# Patient Record
Sex: Female | Born: 1955 | Race: White | Hispanic: No | Smoking: Former smoker
Health system: Southern US, Community
[De-identification: ages and names within clinical notes are randomized; demographics above are authoritative.]

## PROBLEM LIST (undated history)

## (undated) DIAGNOSIS — Z853 Personal history of malignant neoplasm of breast: Secondary | ICD-10-CM

## (undated) DIAGNOSIS — G43909 Migraine, unspecified, not intractable, without status migrainosus: Secondary | ICD-10-CM

## (undated) DIAGNOSIS — B009 Herpesviral infection, unspecified: Secondary | ICD-10-CM

## (undated) DIAGNOSIS — H04123 Dry eye syndrome of bilateral lacrimal glands: Secondary | ICD-10-CM

## (undated) DIAGNOSIS — E669 Obesity, unspecified: Secondary | ICD-10-CM

## (undated) DIAGNOSIS — L719 Rosacea, unspecified: Secondary | ICD-10-CM

## (undated) DIAGNOSIS — C801 Malignant (primary) neoplasm, unspecified: Secondary | ICD-10-CM

## (undated) DIAGNOSIS — M1990 Unspecified osteoarthritis, unspecified site: Secondary | ICD-10-CM

## (undated) DIAGNOSIS — G4730 Sleep apnea, unspecified: Secondary | ICD-10-CM

## (undated) HISTORY — PX: LASIK: SHX215

## (undated) HISTORY — DX: Obesity, unspecified: E66.9

## (undated) HISTORY — DX: Personal history of malignant neoplasm of breast: Z85.3

## (undated) HISTORY — DX: Unspecified osteoarthritis, unspecified site: M19.90

## (undated) HISTORY — PX: JOINT REPLACEMENT: SHX530

## (undated) HISTORY — PX: TOTAL HIP ARTHROPLASTY: SHX124

## (undated) HISTORY — DX: Malignant (primary) neoplasm, unspecified: C80.1

## (undated) HISTORY — DX: Dry eye syndrome of bilateral lacrimal glands: H04.123

## (undated) HISTORY — DX: Herpesviral infection, unspecified: B00.9

## (undated) HISTORY — DX: Sleep apnea, unspecified: G47.30

## (undated) HISTORY — DX: Migraine, unspecified, not intractable, without status migrainosus: G43.909

## (undated) HISTORY — DX: Rosacea, unspecified: L71.9

---

## 2007-09-07 HISTORY — PX: BREAST SURGERY: SHX581

## 2014-07-17 ENCOUNTER — Ambulatory Visit: Payer: Self-pay | Admitting: Family Medicine

## 2014-08-14 ENCOUNTER — Ambulatory Visit (INDEPENDENT_AMBULATORY_CARE_PROVIDER_SITE_OTHER): Payer: BC Managed Care – PPO | Admitting: Physician Assistant

## 2014-08-14 ENCOUNTER — Encounter: Payer: Self-pay | Admitting: Physician Assistant

## 2014-08-14 VITALS — BP 158/83 | HR 104 | Ht 62.5 in | Wt 235.0 lb

## 2014-08-14 DIAGNOSIS — N390 Urinary tract infection, site not specified: Secondary | ICD-10-CM | POA: Diagnosis not present

## 2014-08-14 DIAGNOSIS — R030 Elevated blood-pressure reading, without diagnosis of hypertension: Secondary | ICD-10-CM

## 2014-08-14 DIAGNOSIS — M1712 Unilateral primary osteoarthritis, left knee: Secondary | ICD-10-CM | POA: Diagnosis not present

## 2014-08-14 DIAGNOSIS — E785 Hyperlipidemia, unspecified: Secondary | ICD-10-CM

## 2014-08-14 DIAGNOSIS — H04123 Dry eye syndrome of bilateral lacrimal glands: Secondary | ICD-10-CM

## 2014-08-14 DIAGNOSIS — R319 Hematuria, unspecified: Secondary | ICD-10-CM | POA: Diagnosis not present

## 2014-08-14 DIAGNOSIS — Z131 Encounter for screening for diabetes mellitus: Secondary | ICD-10-CM

## 2014-08-14 DIAGNOSIS — G43001 Migraine without aura, not intractable, with status migrainosus: Secondary | ICD-10-CM

## 2014-08-14 DIAGNOSIS — G4730 Sleep apnea, unspecified: Secondary | ICD-10-CM

## 2014-08-14 DIAGNOSIS — R159 Full incontinence of feces: Secondary | ICD-10-CM

## 2014-08-14 DIAGNOSIS — Z0000 Encounter for general adult medical examination without abnormal findings: Secondary | ICD-10-CM

## 2014-08-14 DIAGNOSIS — IMO0001 Reserved for inherently not codable concepts without codable children: Secondary | ICD-10-CM

## 2014-08-14 LAB — POCT URINALYSIS DIPSTICK
BILIRUBIN UA: NEGATIVE
Glucose, UA: NEGATIVE
Ketones, UA: NEGATIVE
Nitrite, UA: POSITIVE
Protein, UA: 100
Spec Grav, UA: <=1.005
Urobilinogen, UA: 0.2
pH, UA: 6

## 2014-08-14 MED ORDER — CEFUROXIME AXETIL 500 MG PO TABS: 500.0000 mg | ORAL_TABLET | Freq: Two times a day (BID) | ORAL | Status: DC

## 2014-08-15 LAB — COMPLETE METABOLIC PANEL WITH GFR
ALT: 45 U/L — ABNORMAL HIGH (ref 0–35)
AST: 33 U/L (ref 0–37)
Albumin: 4.5 g/dL (ref 3.5–5.2)
Alkaline Phosphatase: 78 U/L (ref 39–117)
BILIRUBIN TOTAL: 0.7 mg/dL (ref 0.2–1.2)
BUN: 10 mg/dL (ref 6–23)
CO2: 23 meq/L (ref 19–32)
CREATININE: 0.56 mg/dL (ref 0.50–1.10)
Calcium: 9.9 mg/dL (ref 8.4–10.5)
Chloride: 105 mEq/L (ref 96–112)
GFR, Est Non African American: >89 mL/min
GLUCOSE: 125 mg/dL — AB (ref 70–99)
Potassium: 4.1 mEq/L (ref 3.5–5.3)
Sodium: 137 mEq/L (ref 135–145)
Total Protein: 7.5 g/dL (ref 6.0–8.3)

## 2014-08-15 LAB — LIPID PANEL
Cholesterol: 261 mg/dL — ABNORMAL HIGH (ref 0–200)
HDL: 53 mg/dL (ref >39)
LDL CALC: 170 mg/dL — AB (ref 0–99)
TRIGLYCERIDES: 191 mg/dL — AB (ref <150)
Total CHOL/HDL Ratio: 4.9 Ratio
VLDL: 38 mg/dL (ref 0–40)

## 2014-08-15 LAB — CBC WITH DIFFERENTIAL/PLATELET
BASOS ABS: 0 10*3/uL (ref 0.0–0.1)
Basophils Relative: 0 % (ref 0–1)
Eosinophils Absolute: 0.1 10*3/uL (ref 0.0–0.7)
Eosinophils Relative: 2 % (ref 0–5)
HCT: 44.4 % (ref 36.0–46.0)
Hemoglobin: 14.8 g/dL (ref 12.0–15.0)
Lymphocytes Relative: 21 % (ref 12–46)
Lymphs Abs: 1.4 10*3/uL (ref 0.7–4.0)
MCH: 29.1 pg (ref 26.0–34.0)
MCHC: 33.3 g/dL (ref 30.0–36.0)
MCV: 87.2 fL (ref 78.0–100.0)
MPV: 9.4 fL (ref 9.4–12.4)
Monocytes Absolute: 0.4 10*3/uL (ref 0.1–1.0)
Monocytes Relative: 6 % (ref 3–12)
NEUTROS ABS: 4.8 10*3/uL (ref 1.7–7.7)
NEUTROS PCT: 71 % (ref 43–77)
PLATELETS: 284 10*3/uL (ref 150–400)
RBC: 5.09 MIL/uL (ref 3.87–5.11)
RDW: 14.1 % (ref 11.5–15.5)
WBC: 6.7 10*3/uL (ref 4.0–10.5)

## 2014-08-15 LAB — VITAMIN B12: VITAMIN B 12: 1412 pg/mL — AB (ref 211–911)

## 2014-08-15 LAB — VITAMIN D 25 HYDROXY (VIT D DEFICIENCY, FRACTURES): Vit D, 25-Hydroxy: 40 ng/mL (ref 30–100)

## 2014-08-15 LAB — TSH: TSH: 3.683 u[IU]/mL (ref 0.350–4.500)

## 2014-08-15 LAB — T4, FREE: FREE T4: 1.02 ng/dL (ref 0.80–1.80)

## 2014-08-17 LAB — URINE CULTURE: Colony Count: >=100000

## 2014-08-19 ENCOUNTER — Encounter: Payer: Self-pay | Admitting: Physician Assistant

## 2014-08-19 DIAGNOSIS — R319 Hematuria, unspecified: Secondary | ICD-10-CM | POA: Insufficient documentation

## 2014-08-19 DIAGNOSIS — G43001 Migraine without aura, not intractable, with status migrainosus: Secondary | ICD-10-CM | POA: Insufficient documentation

## 2014-08-19 DIAGNOSIS — H04123 Dry eye syndrome of bilateral lacrimal glands: Secondary | ICD-10-CM | POA: Insufficient documentation

## 2014-08-19 DIAGNOSIS — R748 Abnormal levels of other serum enzymes: Secondary | ICD-10-CM | POA: Insufficient documentation

## 2014-08-19 DIAGNOSIS — R7301 Impaired fasting glucose: Secondary | ICD-10-CM | POA: Insufficient documentation

## 2014-08-19 DIAGNOSIS — G4730 Sleep apnea, unspecified: Secondary | ICD-10-CM | POA: Insufficient documentation

## 2014-08-19 DIAGNOSIS — R159 Full incontinence of feces: Secondary | ICD-10-CM | POA: Insufficient documentation

## 2014-08-19 DIAGNOSIS — E785 Hyperlipidemia, unspecified: Secondary | ICD-10-CM | POA: Insufficient documentation

## 2014-08-19 DIAGNOSIS — M1712 Unilateral primary osteoarthritis, left knee: Secondary | ICD-10-CM | POA: Insufficient documentation

## 2014-08-19 NOTE — Progress Notes (Signed)
Subjective:    Patient ID: Tina Mcgrath, female    DOB: 15-Dec-1955, 58 y.o.   MRN: [id number]  HPI Patient is a 58 year old female who presents to the clinic to establish care.  .. Active Ambulatory Problems    Diagnosis Date Noted  . Hyperlipidemia 08/19/2014  . Impaired fasting glucose 08/19/2014  . Elevated vitamin B12 level 08/19/2014  . Hematuria 08/19/2014  . Primary osteoarthritis of left knee 08/19/2014  . Dry eyes 08/19/2014  . Migraine without aura and with status migrainosus, not intractable 08/19/2014  . Mild sleep apnea 08/19/2014  . Fecal incontinence 08/19/2014   Resolved Ambulatory Problems    Diagnosis Date Noted  . No Resolved Ambulatory Problems   Past Medical History  Diagnosis Date  . Cancer   . Rosacea   . HSV-2 (herpes simplex virus 2) infection   . Osteoarthritis   . Sleep apnea   . Migraines    .[name] Family History  Problem Relation Age of Onset  . Hyperlipidemia Mother   . Hypertension Mother   . Cancer Father     prostate  . Heart attack Father   . Hypertension Father   . Hyperlipidemia Father   . Diabetes Father   . Alcohol abuse Brother   . Cancer Maternal Uncle     esophageal  . Cancer Paternal Aunt     breast  . Macular degeneration Maternal Grandmother   . Arthritis Maternal Grandmother   . Cancer Maternal Grandfather     multiple myeloma, bone marrow  . Stroke Paternal Grandmother   . Hypertension Paternal Grandmother   . Diabetes Paternal Grandmother   . Osteoporosis Paternal Grandmother   . Heart attack Paternal Grandmother   . Hypertension Paternal Grandfather    .[name] History   Social History  . Marital Status: Divorced    Spouse Name: N/A    Number of Children: N/A  . Years of Education: N/A   Occupational History  . Not on file.   Social History Main Topics  . Smoking status: Former Research scientist (life sciences)  . Smokeless tobacco: Not on file  . Alcohol Use: No  . Drug Use: No  . Sexual Activity: No   Other Topics Concern  .  Not on file   Social History Narrative  . No narrative on file   Patient would like her urine rechecked today. She was recently seen at a urgent care and treated for 7 days for UTI. She always needs 10 days treatment. She continues to have urine odor. She has no other urinary tract symptoms. She always has blood in her urine.  Patient would like to go ahead and get fasting labs she's not had labs done in a while.  Review of Systems  All other systems reviewed and are negative.      Objective:   Physical Exam  Constitutional: She is oriented to person, place, and time. She appears well-developed and well-nourished.  HENT:  Head: Normocephalic and atraumatic.  Cardiovascular: Normal rate, regular rhythm and normal heart sounds.   Pulmonary/Chest: Effort normal and breath sounds normal.  No CVA tendernes.   Abdominal: Soft. Bowel sounds are normal. She exhibits no distension and no mass. There is no tenderness. There is no rebound and no guarding.  Neurological: She is alert and oriented to person, place, and time.  Psychiatric: She has a normal mood and affect. Her behavior is normal.          Assessment & Plan:  Elevated blood pressure-patient brings  in documented wall of blood pressures ranging from the 110-120/75-85. She reports when she comes to the doctor sometimes they are more elevated. Discussed the patient to continue to keep monitoring as needed. No treatment given today.  UTI- nitrates, leukocytes, blood. Hx of hematuria. Patient was treated with ceftin at her request for 10 days. Follow up nurse visit to recheck urine.   Hyperlipidemia-we'll check fasting labs today since last labs were done in 2013.  Screening done for diabetes.

## 2014-09-03 ENCOUNTER — Encounter: Payer: Self-pay | Admitting: Physician Assistant

## 2014-09-03 ENCOUNTER — Ambulatory Visit (INDEPENDENT_AMBULATORY_CARE_PROVIDER_SITE_OTHER): Payer: BC Managed Care – PPO | Admitting: Physician Assistant

## 2014-09-03 VITALS — BP 153/78 | HR 103 | Ht 62.5 in | Wt 235.0 lb

## 2014-09-03 DIAGNOSIS — N390 Urinary tract infection, site not specified: Secondary | ICD-10-CM | POA: Diagnosis not present

## 2014-09-03 DIAGNOSIS — E119 Type 2 diabetes mellitus without complications: Secondary | ICD-10-CM | POA: Diagnosis not present

## 2014-09-03 DIAGNOSIS — R809 Proteinuria, unspecified: Secondary | ICD-10-CM

## 2014-09-03 DIAGNOSIS — R7301 Impaired fasting glucose: Secondary | ICD-10-CM

## 2014-09-03 DIAGNOSIS — R82998 Other abnormal findings in urine: Secondary | ICD-10-CM

## 2014-09-03 DIAGNOSIS — R319 Hematuria, unspecified: Secondary | ICD-10-CM

## 2014-09-03 DIAGNOSIS — E782 Mixed hyperlipidemia: Secondary | ICD-10-CM | POA: Insufficient documentation

## 2014-09-03 LAB — POCT URINALYSIS DIPSTICK
Bilirubin, UA: NEGATIVE
GLUCOSE UA: NEGATIVE
Ketones, UA: NEGATIVE
Nitrite, UA: NEGATIVE
PROTEIN UA: 100
SPEC GRAV UA: 1.01
Urobilinogen, UA: 0.2
pH, UA: 5.5

## 2014-09-03 LAB — POCT GLYCOSYLATED HEMOGLOBIN (HGB A1C): Hemoglobin A1C: 6.6

## 2014-09-03 MED ORDER — VITAMIN D (ERGOCALCIFEROL) 1.25 MG (50000 UNIT) PO CAPS: 50000.0000 [IU] | ORAL_CAPSULE | ORAL | Status: DC

## 2014-09-03 MED ORDER — METFORMIN HCL 500 MG PO TABS: 500.0000 mg | ORAL_TABLET | Freq: Two times a day (BID) | ORAL | Status: DC

## 2014-09-03 MED ORDER — NITROFURANTOIN MONOHYD MACRO 100 MG PO CAPS: 100.0000 mg | ORAL_CAPSULE | Freq: Two times a day (BID) | ORAL | Status: DC

## 2014-09-03 NOTE — Progress Notes (Signed)
   Subjective:    Patient ID: Tina Mcgrath, female    DOB: 1955-09-14, 58 y.o.   MRN: [id number]  HPI  Pt comes in to go over lab results.   She does have hx of recurrent UTI's. She was treated at last visit. Never came back to check for resolution. She now has urinary symptoms again. She has some discomfort and hesistancy when she urinates. She has had some lower back pain. No fever, chills, n/v/d.   Review of Systems  All other systems reviewed and are negative.      Objective:   Physical Exam  Constitutional: She is oriented to person, place, and time. She appears well-developed and well-nourished.  HENT:  Head: Normocephalic and atraumatic.  Cardiovascular: Normal rate, regular rhythm and normal heart sounds.   Pulmonary/Chest: Effort normal and breath sounds normal.  No CVA tenderness.   Abdominal: Soft. Bowel sounds are normal. She exhibits no distension and no mass. There is no tenderness. There is no rebound and no guarding.  Neurological: She is alert and oriented to person, place, and time.  Skin: Skin is dry.  Psychiatric: She has a normal mood and affect. Her behavior is normal.          Assessment & Plan:  DM, controlled type II- A1C is 6.6. Metformin started today. 500mg  bid. Discussed side effects. Follow up as needed or in 3 months for a1c check. Discuss referral to nutritionist. Pt declined at this time.   Hyperlipidemia- since dx of DM new goal for LDL. Pt not toleratant of many statins. She would like to try livalo. No samples today. We are calling drug rep to get samples then pt will do a one month trial. disucssed diet and exercise changes today.   UTI- trace leuks and protein in urine. Will culture and since symptomatic treat at this time. macrobid given for 7 days. May need to consider referral to urology. Need to recheck and make sure protein clears as well.

## 2014-09-05 ENCOUNTER — Other Ambulatory Visit: Payer: Self-pay | Admitting: *Deleted

## 2014-09-05 LAB — URINE CULTURE: Colony Count: 5000

## 2014-09-05 MED ORDER — FLUCONAZOLE 150 MG PO TABS: 150.0000 mg | ORAL_TABLET | Freq: Once | ORAL | Status: DC

## 2014-09-10 ENCOUNTER — Encounter: Payer: Self-pay | Admitting: Physician Assistant

## 2014-09-10 ENCOUNTER — Other Ambulatory Visit: Payer: Self-pay | Admitting: *Deleted

## 2014-09-10 ENCOUNTER — Ambulatory Visit (INDEPENDENT_AMBULATORY_CARE_PROVIDER_SITE_OTHER): Payer: 59 | Admitting: Physician Assistant

## 2014-09-10 VITALS — BP 152/82 | HR 96 | Temp 97.5°F | Wt 235.0 lb

## 2014-09-10 DIAGNOSIS — R809 Proteinuria, unspecified: Secondary | ICD-10-CM | POA: Insufficient documentation

## 2014-09-10 DIAGNOSIS — N390 Urinary tract infection, site not specified: Secondary | ICD-10-CM

## 2014-09-10 DIAGNOSIS — R319 Hematuria, unspecified: Secondary | ICD-10-CM

## 2014-09-10 LAB — POCT URINALYSIS DIPSTICK
BILIRUBIN UA: NEGATIVE
Glucose, UA: NEGATIVE
Ketones, UA: NEGATIVE
LEUKOCYTES UA: NEGATIVE
Nitrite, UA: NEGATIVE
PH UA: 6
Protein, UA: 100
Spec Grav, UA: 1.01
Urobilinogen, UA: 0.2

## 2014-09-10 NOTE — Progress Notes (Signed)
   Subjective:    Patient ID: Tina Mcgrath, female    DOB: 1956-08-03, 59 y.o.   MRN: [id number]  HPI U Tina Mcgrath comes to office today for urine follow up. She said that she had a recent infection and does have a history of following with a urologist in the past. She denies burning or dysuria at this time. No other concerns. She said that she finished macrobid yesterday and started the diflucan today.    Review of Systems     Objective:   Physical Exam        Assessment & Plan:  UTI- seemed to be cleared. Trace blood and protien left. Hx of hematuria. Will get mircoscopic to evaluate for protein.

## 2014-09-12 ENCOUNTER — Other Ambulatory Visit: Payer: Self-pay | Admitting: *Deleted

## 2014-09-12 DIAGNOSIS — R809 Proteinuria, unspecified: Secondary | ICD-10-CM

## 2014-09-13 LAB — URINALYSIS, MICROSCOPIC ONLY
Bacteria, UA: NONE SEEN
CASTS: NONE SEEN
Crystals: NONE SEEN
Squamous Epithelial / LPF: NONE SEEN

## 2014-11-12 ENCOUNTER — Ambulatory Visit (INDEPENDENT_AMBULATORY_CARE_PROVIDER_SITE_OTHER): Payer: 59 | Admitting: Physician Assistant

## 2014-11-12 ENCOUNTER — Encounter: Payer: Self-pay | Admitting: Physician Assistant

## 2014-11-12 VITALS — BP 147/84 | HR 102 | Ht 62.5 in | Wt 230.0 lb

## 2014-11-12 DIAGNOSIS — C50911 Malignant neoplasm of unspecified site of right female breast: Secondary | ICD-10-CM | POA: Diagnosis not present

## 2014-11-12 DIAGNOSIS — IMO0001 Reserved for inherently not codable concepts without codable children: Secondary | ICD-10-CM

## 2014-11-12 DIAGNOSIS — R319 Hematuria, unspecified: Secondary | ICD-10-CM

## 2014-11-12 DIAGNOSIS — R030 Elevated blood-pressure reading, without diagnosis of hypertension: Secondary | ICD-10-CM

## 2014-11-12 DIAGNOSIS — N390 Urinary tract infection, site not specified: Secondary | ICD-10-CM

## 2014-11-12 DIAGNOSIS — Z1239 Encounter for other screening for malignant neoplasm of breast: Secondary | ICD-10-CM

## 2014-11-12 DIAGNOSIS — E785 Hyperlipidemia, unspecified: Secondary | ICD-10-CM

## 2014-11-12 DIAGNOSIS — R82998 Other abnormal findings in urine: Secondary | ICD-10-CM

## 2014-11-12 DIAGNOSIS — E118 Type 2 diabetes mellitus with unspecified complications: Secondary | ICD-10-CM

## 2014-11-12 DIAGNOSIS — R809 Proteinuria, unspecified: Secondary | ICD-10-CM

## 2014-11-12 LAB — POCT URINALYSIS DIPSTICK
Bilirubin, UA: NEGATIVE
Glucose, UA: NEGATIVE
KETONES UA: NEGATIVE
NITRITE UA: POSITIVE
PROTEIN UA: 30
Spec Grav, UA: 1.015
Urobilinogen, UA: 0.2
pH, UA: 5.5

## 2014-11-12 LAB — POCT UA - MICROALBUMIN
Creatinine, POC: 200 mg/dL
Microalbumin Ur, POC: 80 mg/L

## 2014-11-12 LAB — POCT GLYCOSYLATED HEMOGLOBIN (HGB A1C): HEMOGLOBIN A1C: 6.1

## 2014-11-12 MED ORDER — VALACYCLOVIR HCL 500 MG PO TABS: 500.0000 mg | ORAL_TABLET | ORAL | Status: DC | PRN

## 2014-11-12 MED ORDER — SUMATRIPTAN SUCCINATE 100 MG PO TABS: 100.0000 mg | ORAL_TABLET | ORAL | Status: DC | PRN

## 2014-11-12 MED ORDER — PITAVASTATIN CALCIUM 2 MG PO TABS
ORAL_TABLET | ORAL | Status: DC
Start: 2014-11-12 — End: 2014-12-10

## 2014-11-12 MED ORDER — METFORMIN HCL 500 MG PO TABS: 500.0000 mg | ORAL_TABLET | Freq: Two times a day (BID) | ORAL | Status: DC

## 2014-11-12 NOTE — Progress Notes (Signed)
   Subjective:    Patient ID: Tina Mcgrath, female    DOB: 1955/12/21, 59 y.o.   MRN: [id number]  HPI  Pt is a 59 yo female who presents to the clinic for 3 month follow up on diabetes. Doing well. Started metformin. When checking sugars running 75-115 fasting. No hypoglycemic events. No problems or concerned. Lost 5lbs. Trying to walk more and decrease carbs/sugars in diet.   BC, right- remission- needs 3D mammogram ordered.   Hx of UTI's and hematuria- no symptoms today. Will recheck urine.    Review of Systems  All other systems reviewed and are negative.      Objective:   Physical Exam  Constitutional: She is oriented to person, place, and time. She appears well-developed and well-nourished.  Obese.   HENT:  Head: Normocephalic and atraumatic.  Cardiovascular: Normal rate, regular rhythm and normal heart sounds.   Pulmonary/Chest: Effort normal and breath sounds normal.  No CVA tenderness.   Abdominal: Soft. Bowel sounds are normal. She exhibits no distension and no mass. There is no tenderness. There is no rebound and no guarding.  Neurological: She is alert and oriented to person, place, and time.  Psychiatric: She has a normal mood and affect. Her behavior is normal.          Assessment & Plan:  DM, type II- .[name] Lab Results  Component Value Date   HGBA1C 6.1 11/12/2014  looks great.  Continue metformin 500mg  bid.  Continue diet and exercise.  Encouraged eye exam.  mircoalbumin abnormal today. Will wait to send to nephrology until hematuria evaluated. microscopic negative.    Recurrent UTI/proteinuria/hematuria- . Results for orders placed or performed in visit on 11/12/14  Urinalysis, microscopic only  Result Value Ref Range   Squamous Epithelial / LPF NONE SEEN RARE   Crystals NONE SEEN NONE SEEN   Casts NONE SEEN NONE SEEN   WBC, UA 7-10 (A) <3 WBC/hpf   RBC / HPF 0-2 <3 RBC/hpf   Bacteria, UA MANY (A) RARE  POCT urinalysis dipstick  Result Value Ref  Range   Color, UA yellow    Clarity, UA cloudy    Glucose, UA neg    Bilirubin, UA neg    Ketones, UA neg    Spec Grav, UA 1.015    Blood, UA small    pH, UA 5.5    Protein, UA 30    Urobilinogen, UA 0.2    Nitrite, UA pos    Leukocytes, UA Trace   POCT HgB A1C  Result Value Ref Range   Hemoglobin A1C 6.1   POCT UA - Microalbumin  Result Value Ref Range   Microalbumin Ur, POC 80 mg/L   Creatinine, POC 200 mg/dL   Albumin/Creatinine Ratio, Urine, POC 30-300    Will culture. Last UTI had same dipstick results but insignifcant growth on culture. Since pt is asymptomatic will wait to treat until culture comes back. mircoalbumin abnormal and DM controlled. Will wait to send to nephrology since hematuria could be effecting protein and mircoalbumin. Will continue to monitor.    Hx of right breast cancer- needs 3D imaging ordered/diagnostic for follow up.   Elevated blood pressure- brings in monitor and shows me BP around low 120's to 75-85 on regular basis.   Hyperlipidemia- waited to start since starting metformin. Cannot tolerate statins. Given one month of livalo samples to try. Start with 3 times a week and gradually increase.

## 2014-11-12 NOTE — Patient Instructions (Signed)
Will get urology referral.

## 2014-11-13 DIAGNOSIS — C50919 Malignant neoplasm of unspecified site of unspecified female breast: Secondary | ICD-10-CM | POA: Insufficient documentation

## 2014-11-13 DIAGNOSIS — R030 Elevated blood-pressure reading, without diagnosis of hypertension: Secondary | ICD-10-CM

## 2014-11-13 DIAGNOSIS — IMO0001 Reserved for inherently not codable concepts without codable children: Secondary | ICD-10-CM | POA: Insufficient documentation

## 2014-11-13 LAB — URINALYSIS, MICROSCOPIC ONLY
CASTS: NONE SEEN
Crystals: NONE SEEN
Squamous Epithelial / LPF: NONE SEEN

## 2014-11-15 ENCOUNTER — Other Ambulatory Visit: Payer: Self-pay | Admitting: Physician Assistant

## 2014-11-15 LAB — URINE CULTURE: Colony Count: >=100000

## 2014-11-15 MED ORDER — NITROFURANTOIN MONOHYD MACRO 100 MG PO CAPS: 100.0000 mg | ORAL_CAPSULE | Freq: Two times a day (BID) | ORAL | Status: DC

## 2014-11-28 ENCOUNTER — Telehealth: Payer: Self-pay | Admitting: *Deleted

## 2014-11-28 DIAGNOSIS — E118 Type 2 diabetes mellitus with unspecified complications: Secondary | ICD-10-CM

## 2014-11-28 NOTE — Telephone Encounter (Signed)
Referral placed.

## 2014-12-09 ENCOUNTER — Telehealth: Payer: Self-pay | Admitting: *Deleted

## 2014-12-09 NOTE — Telephone Encounter (Signed)
Patient called in concerned she hasn't heard from referrals made to the opthalmology  & [place] (3-D mammogram).  Also states her insurance only pays for 90 day supply so she needs  Levlo 10 mg changed to a 90 day

## 2014-12-10 ENCOUNTER — Telehealth: Payer: Self-pay | Admitting: Physician Assistant

## 2014-12-10 ENCOUNTER — Other Ambulatory Visit: Payer: Self-pay | Admitting: Physician Assistant

## 2014-12-10 DIAGNOSIS — C50911 Malignant neoplasm of unspecified site of right female breast: Secondary | ICD-10-CM

## 2014-12-10 MED ORDER — PITAVASTATIN CALCIUM 2 MG PO TABS: ORAL_TABLET | ORAL | Status: DC

## 2014-12-10 NOTE — Telephone Encounter (Signed)
Both were ordered at last visit. Ortho referral as below.   Note    Sent referral with ov notes and insurance to [facility] at [phone] and [phone] they will call and schedule with patient for good appt time. Also sent referral through [facility] and received Auth #[id number] for 6 Visits good from 11/28/2014 - 05/31/2015. - CF     Can we call breast clinic and see what hold up is on 3D mammogram.   Please inform patient.

## 2014-12-10 NOTE — Telephone Encounter (Signed)
Received fax from [facility] and Livalo 2 mg is approved through 12/10/19 or until medication is no longer available on plan. auth # [id number] -Cf

## 2014-12-10 NOTE — Telephone Encounter (Signed)
Pt states she is taking mammogram order to [place] for completion.

## 2014-12-10 NOTE — Telephone Encounter (Signed)
Received fax for pa on Livalo 2mg  sent through cover my meds waiting on auth. - CF

## 2014-12-10 NOTE — Telephone Encounter (Signed)
I sent 90 supply to [facility] is that the right pharmacy?

## 2014-12-10 NOTE — Telephone Encounter (Signed)
Will route to [facility] for mammogram status - Cf

## 2014-12-10 NOTE — Telephone Encounter (Signed)
Patient called advised that she just needs a 3D Mammogram order today to take with her to [place] tomorrow. Patient states she knows her hmo is not going to pay for it so she is paying self pay and that office still needs an order for their records. They are not going to pay because it is [place] of state and they will send [name] a report. And patient stated that the sample script livalo worked and she would like a 9o day supply per Direese note. Thanks

## 2015-01-06 ENCOUNTER — Encounter: Payer: Self-pay | Admitting: Physician Assistant

## 2015-01-06 DIAGNOSIS — N3020 Other chronic cystitis without hematuria: Secondary | ICD-10-CM | POA: Insufficient documentation

## 2015-01-14 LAB — HM MAMMOGRAPHY: HM MAMMO: NORMAL

## 2015-02-19 ENCOUNTER — Encounter: Payer: Self-pay | Admitting: Physician Assistant

## 2015-03-01 ENCOUNTER — Other Ambulatory Visit: Payer: Self-pay | Admitting: Physician Assistant

## 2015-03-20 LAB — HM DIABETES EYE EXAM

## 2015-05-20 ENCOUNTER — Ambulatory Visit (INDEPENDENT_AMBULATORY_CARE_PROVIDER_SITE_OTHER): Payer: 59 | Admitting: Physician Assistant

## 2015-05-20 ENCOUNTER — Encounter: Payer: Self-pay | Admitting: Physician Assistant

## 2015-05-20 ENCOUNTER — Ambulatory Visit (INDEPENDENT_AMBULATORY_CARE_PROVIDER_SITE_OTHER): Payer: 59

## 2015-05-20 ENCOUNTER — Telehealth: Payer: Self-pay | Admitting: Physician Assistant

## 2015-05-20 VITALS — BP 143/56 | HR 101 | Ht 62.5 in | Wt 223.0 lb

## 2015-05-20 DIAGNOSIS — M25531 Pain in right wrist: Secondary | ICD-10-CM | POA: Diagnosis not present

## 2015-05-20 DIAGNOSIS — R252 Cramp and spasm: Secondary | ICD-10-CM | POA: Diagnosis not present

## 2015-05-20 DIAGNOSIS — Z1159 Encounter for screening for other viral diseases: Secondary | ICD-10-CM

## 2015-05-20 DIAGNOSIS — S93401A Sprain of unspecified ligament of right ankle, initial encounter: Secondary | ICD-10-CM | POA: Diagnosis not present

## 2015-05-20 DIAGNOSIS — E119 Type 2 diabetes mellitus without complications: Secondary | ICD-10-CM | POA: Diagnosis not present

## 2015-05-20 DIAGNOSIS — E785 Hyperlipidemia, unspecified: Secondary | ICD-10-CM

## 2015-05-20 DIAGNOSIS — N3020 Other chronic cystitis without hematuria: Secondary | ICD-10-CM

## 2015-05-20 DIAGNOSIS — E559 Vitamin D deficiency, unspecified: Secondary | ICD-10-CM

## 2015-05-20 DIAGNOSIS — Z114 Encounter for screening for human immunodeficiency virus [HIV]: Secondary | ICD-10-CM

## 2015-05-20 LAB — CBC WITH DIFFERENTIAL/PLATELET
BASOS ABS: 0 10*3/uL (ref 0.0–0.1)
Basophils Relative: 0 % (ref 0–1)
EOS ABS: 0.2 10*3/uL (ref 0.0–0.7)
Eosinophils Relative: 3 % (ref 0–5)
HEMATOCRIT: 42.1 % (ref 36.0–46.0)
Hemoglobin: 14.1 g/dL (ref 12.0–15.0)
LYMPHS ABS: 1.6 10*3/uL (ref 0.7–4.0)
LYMPHS PCT: 31 % (ref 12–46)
MCH: 29.2 pg (ref 26.0–34.0)
MCHC: 33.5 g/dL (ref 30.0–36.0)
MCV: 87.2 fL (ref 78.0–100.0)
MPV: 9.1 fL (ref 8.6–12.4)
Monocytes Absolute: 0.5 10*3/uL (ref 0.1–1.0)
Monocytes Relative: 9 % (ref 3–12)
NEUTROS PCT: 57 % (ref 43–77)
Neutro Abs: 2.9 10*3/uL (ref 1.7–7.7)
PLATELETS: 276 10*3/uL (ref 150–400)
RBC: 4.83 MIL/uL (ref 3.87–5.11)
RDW: 14.3 % (ref 11.5–15.5)
WBC: 5 10*3/uL (ref 4.0–10.5)

## 2015-05-20 LAB — VITAMIN B12: Vitamin B-12: 457 pg/mL (ref 211–911)

## 2015-05-20 LAB — POCT GLYCOSYLATED HEMOGLOBIN (HGB A1C): Hemoglobin A1C: 6.1

## 2015-05-20 LAB — FERRITIN: Ferritin: 93 ng/mL (ref 10–291)

## 2015-05-20 MED ORDER — METFORMIN HCL 500 MG PO TABS: 500.0000 mg | ORAL_TABLET | Freq: Two times a day (BID) | ORAL | Status: DC

## 2015-05-20 MED ORDER — LISINOPRIL 2.5 MG PO TABS: 2.5000 mg | ORAL_TABLET | Freq: Every day | ORAL | Status: DC

## 2015-05-20 NOTE — Telephone Encounter (Signed)
Pt is ok with starting a very low dose ACE.

## 2015-05-20 NOTE — Telephone Encounter (Signed)
Call patient-I just realized after reviewing chart that patient is not on a Ace inhibitor. Is there any reason why she is not on this. If not I would like to start a low-dose ace today. This will help to protect her kidneys especially with her positive microalbumin.

## 2015-05-20 NOTE — Patient Instructions (Addendum)
Will send [facility] new referral for next year.  Will get records from [facility] for eye exam.   Acute Ankle Sprain with Phase II Rehab An acute ankle sprain is a partial or complete tear in one or more of the ligaments of the ankle due to traumatic injury. The severity of the injury depends on both the number of ligaments sprained and the grade of sprain. There are 3 grades of sprains.  A grade 1 sprain is a mild sprain. There is a slight pull without obvious tearing. There is no loss of strength, and the muscle and ligament are the correct length.  A grade 2 sprain is a moderate sprain. There is tearing of fibers within the substance of the ligament where it connects two bones or two cartilages. The length of the ligament is increased, and there is usually decreased strength.  A grade 3 sprain is a complete rupture of the ligament and is uncommon. In addition to the grade of sprain, there are 3 types of ankle sprains.  Lateral ankle sprains. This is a sprain of one or more of the 3 ligaments on the outer side (lateral) of the ankle. These are the most common sprains. Medial ankle sprains. There is one large triangular ligament on the inner side (medial) of the ankle that is susceptible to injury. Medial ankle sprains are less common. Syndesmosis, "high ankle," sprains. The syndesmosis is the ligament that connects the two bones of the lower leg. Syndesmosis sprains usually only occur with very severe ankle sprains. SYMPTOMS  Pain, tenderness, and swelling in the ankle, starting at the side of injury that may progress to the whole ankle and foot with time.  "Pop" or tearing sensation at the time of injury.  Bruising that may spread to the heel.  Impaired ability to walk soon after injury. CAUSES   Acute ankle sprains are caused by trauma placed on the ankle that temporarily forces or pries the anklebone (talus) out of its normal socket.  Stretching or tearing of the  ligaments that normally hold the joint in place (usually due to a twisting injury). RISK INCREASES WITH:  Previous ankle sprain.  Sports in which the foot may land awkwardly (basketball, volleyball, soccer) or walking or running on uneven or rough surfaces.  Shoes with inadequate support to prevent sideways motion when stress occurs.  Poor strength and flexibility.  Poor balance skills.  Contact sports. PREVENTION  Warm up and stretch properly before activity.  Maintain physical fitness:  Ankle and leg flexibility, muscle strength, and endurance.  Cardiovascular fitness.  Balance training activities.  Use proper technique and have a coach correct improper technique.  Taping, protective strapping, bracing, or high-top tennis shoes may help prevent injury. Initially, tape is best. However, it loses most of its support function within 10 to 15 minutes.  Wear proper fitted protective shoes. Combining high-top shoes with taping or bracing is more effective than using either alone.  Provide the ankle with support during sports and practice activities for 12 months following injury. PROGNOSIS   If treated properly, ankle sprains can be expected to recover completely. However, the length of recovery depends on the degree of injury.  A grade 1 sprain usually heals enough in 5 to 7 days to allow modified activity and requires an average of 6 weeks to heal completely.  A grade 2 sprain requires 6 to 10 weeks to heal completely.  A grade 3 sprain requires 12 to 16 weeks to heal.  A syndesmosis sprain often takes more than 3 months to heal. RELATED COMPLICATIONS   Frequent recurrence of symptoms may result in a chronic problem. Appropriately addressing the problem the first time decreases the frequency of recurrence and optimizes healing time. Severity of initial sprain does not predict the likelihood of later instability.  Injury to other structures (bone, cartilage, or  tendon).  Chronically unstable or arthritic ankle joint are possible with repeated sprains. TREATMENT Treatment initially involves the use of ice, medicine, and compression bandages to help reduce pain and inflammation. Ankle sprains are usually immobilized in a walking cast or boot to allow for healing. Crutches may be recommended to reduce pressure on the injury. After immobilization, strengthening and stretching exercises may be necessary to regain strength and a full range of motion. Surgery is rarely needed to treat ankle sprains. MEDICATION   Nonsteroidal anti-inflammatory medicines, such as aspirin and ibuprofen (do not take for the first 3 days after injury or within 7 days before surgery), or other minor pain relievers, such as acetaminophen, are often recommended. Take these as directed by your caregiver. Contact your caregiver immediately if any bleeding, stomach upset, or signs of an allergic reaction occur from these medicines.  Ointments applied to the skin may be helpful.  Pain relievers may be prescribed as necessary by your caregiver. Do not take prescription pain medicine for longer than 4 to 7 days. Use only as directed and only as much as you need. HEAT AND COLD  Cold treatment (icing) is used to relieve pain and reduce inflammation for acute and chronic cases. Cold should be applied for 10 to 15 minutes every 2 to 3 hours for inflammation and pain and immediately after any activity that aggravates your symptoms. Use ice packs or an ice massage.  Heat treatment may be used before performing stretching and strengthening activities prescribed by your caregiver. Use a heat pack or a warm soak. SEEK IMMEDIATE MEDICAL CARE IF:   Pain, swelling, or bruising worsens despite treatment.  You experience pain, numbness, discoloration, or coldness in the foot or toes.  New, unexplained symptoms develop. (Drugs used in treatment may produce side effects.) EXERCISES  PHASE II  EXERCISES RANGE OF MOTION (ROM) AND STRETCHING EXERCISES - Ankle Sprain, Acute-Phase II, Weeks 3 to 4 After your physician, physical therapist, or athletic trainer feels your knee has made progress significant enough to begin more advanced exercises, he or she may recommend completing some of the following exercises. Although each person heals at different rates, most people will be ready for these exercises between 3 and 4 weeks after their injury. Do not begin these exercises until you have your caregiver's permission. He or she may also advise you to continue with the exercises which you completed in Phase I of your rehabilitation. While completing these exercises, remember:   Restoring tissue flexibility helps normal motion to return to the joints. This allows healthier, less painful movement and activity.  An effective stretch should be held for at least 30 seconds.  A stretch should never be painful. You should only feel a gentle lengthening or release in the stretched tissue. RANGE OF MOTION - Ankle Plantar Flexion   Sit with your right / left leg crossed over your opposite knee.  Use your opposite hand to pull the top of your foot and toes toward you.  You should feel a gentle stretch on the top of your foot/ankle. Hold this position for __________. Repeat __________ times. Complete __________ times per day.  RANGE OF MOTION - Ankle Eversion  Sit with your right / left ankle crossed over your opposite knee.  Grip your foot with your opposite hand, placing your thumb on the top of your foot and your fingers across the bottom of your foot.  Gently push your foot downward with a slight rotation so your littlest toes rise slightly  You should feel a gentle stretch on the inside of your ankle. Hold the stretch for __________ seconds. Repeat __________ times. Complete this exercise __________ times per day.  RANGE OF MOTION - Ankle Inversion  Sit with your right / left ankle crossed  over your opposite knee.  Grip your foot with your opposite hand, placing your thumb on the bottom of your foot and your fingers across the top of your foot.  Gently pull your foot so the smallest toe comes toward you and your thumb pushes the inside of the ball of your foot away from you.  You should feel a gentle stretch on the outside of your ankle. Hold the stretch for __________ seconds. Repeat __________ times. Complete this exercise __________ times per day.  STRETCH - Gastrocsoleus  Sit with your right / left leg extended. Holding onto both ends of a belt or towel, loop it around the ball of your foot.  Keeping your right / left ankle and foot relaxed and your knee straight, pull your foot and ankle toward you using the belt/towel.  You should feel a gentle stretch behind your calf or knee. Hold this position for __________ seconds. Repeat __________ times. Complete this stretch __________ times per day.  RANGE OF MOTION - Ankle Dorsiflexion, Active Assisted  Remove shoes and sit on a chair that is preferably not on a carpeted surface.  Place right / left foot under knee. Extend your opposite leg for support.  Keeping your heel down, slide your right / left foot back toward the chair until you feel a stretch at your ankle or calf. If you do not feel a stretch, slide your bottom forward to the edge of the chair while still keeping your heel down.  Hold this stretch for __________ seconds. Repeat __________ times. Complete this stretch __________ times per day.  STRETCH - Gastroc, Standing   Place hands on wall.  Extend right / left leg and place a folded washcloth under the arch of your foot for support. Keep the front knee somewhat bent.  Slightly point your toes inward on your back foot.  Keeping your right / left heel on the floor and your knee straight, shift your weight toward the wall, not allowing your back to arch.  You should feel a gentle stretch in the calf. Hold  this position for __________ seconds. Repeat __________ times. Complete this stretch __________ times per day. STRETCH - Soleus, Standing  Place hands on wall.  Extend right / left leg and place a folded washcloth under the arch of your foot for support. Keep the front knee somewhat bent.  Slightly point your toes inward on your back foot.  Keep your right / left heel on the floor, bend your back knee, and slightly shift your weight over the back leg so that you feel a gentle stretch deep in your back calf.  Hold this position for __________ seconds. Repeat __________ times. Complete this stretch __________ times per day. STRETCH - Gastrocsoleus, Standing Note: This exercise can place a lot of stress on your foot and ankle. Please complete this exercise only if specifically instructed  by your caregiver.   Place the ball of your right / left foot on a step, keeping your other foot firmly on the same step.  Hold on to the wall or a rail for balance.  Slowly lift your other foot, allowing your body weight to press your heel down over the edge of the step.  You should feel a stretch in your right / left calf.  Hold this position for __________ seconds.  Repeat this exercise with a slight bend in your knee. Repeat __________ times. Complete this stretch __________ times per day.  STRENGTHENING EXERCISES - Ankle Sprain, Acute-Phase II Around 3 to 4 weeks after your injury, you may progress to some of these exercises in your rehabilitation program. Do not begin these until you have your caregiver's permission. Although your condition has improved, the Phase I exercises will continue to be helpful and you may continue to complete them. As you complete strengthening exercises, remember:   Strong muscles with good endurance tolerate stress better.  Do the exercises as initially prescribed by your caregiver. Progress slowly with each exercise, gradually increasing the number of repetitions and  weight used under his or her guidance.  You may experience muscle soreness or fatigue, but the pain or discomfort you are trying to eliminate should never worsen during these exercises. If this pain does worsen, stop and make certain you are following the directions exactly. If the pain is still present after adjustments, discontinue the exercise until you can discuss the trouble with your caregiver. STRENGTH - Plantar-flexors, Standing  Stand with your feet shoulder width apart. Steady yourself with a wall or table using as little support as needed.  Keeping your weight evenly spread over the width of your feet, rise up on your toes.*  Hold this position for __________ seconds. Repeat __________ times. Complete this exercise __________ times per day.  *If this is too easy, shift your weight toward your right / left leg until you feel challenged. Ultimately, you may be asked to do this exercise with your right / left foot only. STRENGTH - Dorsiflexors and Plantar-flexors, Heel/toe Walking  Dorsiflexion: Walk on your heels only. Keep your toes as high as possible.  Walk for ____________________ seconds/feet.  Repeat __________ times. Complete __________ times per day.  Plantar flexion: Walk on your toes only. Keep your heels as high as possible.  Walk for ____________________ seconds/feet. Repeat __________ times. Complete __________ times per day.  BALANCE - Tandem Walking  Place your uninjured foot on a line 2 to 4 inches wide and at least 10 feet long.  Keeping your balance without using anything for extra support, place your right / left heel directly in front of your other foot.  Slowly raise your back foot up, lifting from the heel to the toes, and place it directly in front of the right / left foot.  Continue to walk along the line slowly. Walk for ____________________ feet. Repeat ____________________ times. Complete ____________________ times per day. BALANCE -  Inversion/Eversion Use caution, these are advanced level exercises. Do not begin them until you are advised to do so.   Create a balance board using a sturdy board about 1  feet long and at 1 to 1  feet wide and a 1  inch diameter rod or pipe that is as long as the board's width. A copper pipe or a solid broomstick work well.  Stand on a non-carpeted surface near a countertop or wall. Step onto the board so that  your feet are hip-width apart and equally straddle the rod/pipe.  Keeping your feet in place, complete these two exercises without shifting your upper body or hips:  Tip the board from side-to-side. Control the movement so the board does not forcefully strike the ground. The board should silently tap the ground.  Tip the board side-to-side without striking the ground. Occasionally pause and maintain a steady position at various points.  Repeat the first two exercises, but use only your right / left foot. Place your right / left foot directly over the rod/pipe. Repeat __________ times. Complete this exercise __________ times a day. BALANCE - Plantar/Dorsi Flexion Use caution, these are advanced level exercises. Do not begin them until you are advised to do so.   Create a balance board using a sturdy board about 1  feet long and at 1 to 1  feet wide and a 1  inch diameter rod or pipe that is as long as the board's width. A copper pipe or a solid broomstick work well.  Stand on a non-carpeted surface near a countertop or wall. Stand on the board so that the rod/pipe runs under the arches in your feet.  Keeping your feet in place, complete these two exercises without shifting your upper body or hips:  Tip the board from side-to-side. Control the movement so the board does not forcefully strike the ground. The board should silently tap the ground.  Tip the board side-to-side without striking the ground. Occasionally pause and maintain a steady position at various points.  Repeat  the first two exercises, but use only your right / left foot. Stand in the center of the board. Repeat __________ times. Complete this exercise __________ times a day. STRENGTH - Plantar-flexors, Eccentric Note: This exercise can place a lot of stress on your foot and ankle. Please complete this exercise only if specifically instructed by your caregiver.   Place the balls of your feet on a step. With your hands, use only enough support from a wall or rail to keep your balance.  Keep your knees straight and rise up on your toes.  Slowly shift your weight entirely to your toes and pick up your opposite foot. Gently and with controlled movement, lower your weight through your right / left foot so that your heel drops below the level of the step. You will feel a slight stretch in the back of your calf at the ending position.  Use the healthy leg to help rise up onto the balls of both feet, then lower weight only on the right / left leg again. Build up to 15 repetitions. Then progress to 3 consecutive sets of 15 repetitions.*  After completing the above exercise, complete the same exercise with a slight knee bend (about 30 degrees). Again, build up to 15 repetitions. Then progress to 3 consecutive sets of 15 repetitions.* Perform this exercise __________ times per day.  *When you easily complete 3 sets of 15, your physician, physical therapist, or athletic trainer may advise you to add resistance by wearing a backpack filled with additional weight. Document Released: 12/13/2005 Document Revised: 11/15/2011 Document Reviewed: 12/05/2008 [facility] Patient Information 2015 [place], [place]. This information is not intended to replace advice given to you by your health care provider. Make sure you discuss any questions you have with your health care provider.

## 2015-05-20 NOTE — Progress Notes (Signed)
Subjective:    Patient ID: Tina Mcgrath, female    DOB: 1955-11-20, 59 y.o.   MRN: [id number]  HPI  Pt presents to the clinic for 3 month follow up. She has multiple concerns today.   DM- taking metformin. She declines any strenuous exercise but she does try to walk daily when she goes to the mailbox. She denies any hypoglycemic events. She is not checking her sugar regularly. She denies any neuropathy. Patient did recently get eye exam and was normal.   Patient does complain of some right lateral ankle pain. Approximately 6 weeks ago she was walking out to the mailbox and twisted her ankle. She noted a lot of swelling and tenderness. She elevated her ankle and iced it. It has gotten mostly better. She still has some pain in the lateral ankle with inversion movements. She does not take anything regularly for pain. She is not wearing an ankle brace today.  Hyperlipidemia-she is taking little low once weekly. She had gotten up to every day but reported muscle cramps and pains got too bad. After decreasing to once weekly she does feel like they have improved. She will still have muscle cramps and pains on the night she takes little low. She does not do anything else to help remedy muscle cramps.  Patient is still being managed by urology for chronic cystitis. She takes a daily Macrobid for UTI prevention.  Patient does also complain of some right wrist weakness and discomfort. She has had this ongoing for one year last July she was scary and 48 pounds of paper and her wrist became unstable and she dropped the papers. Ever since then she has had some occasional pain but what is most concerning is the weakness. When she tries to lift heavy objects  her wrist gives out. She does wear a brace for wrist support. She is not taking any regular anti-inflammatory's but does take some occasionally.      Review of Systems  All other systems reviewed and are negative.      Objective:   Physical Exam   Constitutional: She is oriented to person, place, and time. She appears well-developed and well-nourished.  Obese.   HENT:  Head: Normocephalic and atraumatic.  Cardiovascular: Normal rate, regular rhythm and normal heart sounds.   Pulmonary/Chest: Effort normal and breath sounds normal. She has no wheezes.  Musculoskeletal:  Right ankle:  No swelling or brusing.  No calf tenderness or swelling.  Tenderness with inversion of ankle. No bony tenderness.  Strength of ankle 5/5.   Right wrist: No bruising or swelling.  Negative tinels.  Negative phalens.  Negative finklestein.  Tenderness of radial head and into MCP or thumb. ROm normal and without pain.  Strength of thumb and wrist 5/5.   Neurological: She is alert and oriented to person, place, and time.  Psychiatric: She has a normal mood and affect. Her behavior is normal.          Assessment & Plan:  DM- .[name] Lab Results  Component Value Date   HGBA1C 6.1 05/20/2015   Stable from 3 months ago.  Foot exam normal today. Added ACE today since she has not been on one.  Will send for copy of eye exam she had done this year.  Continue on metformin. Will recheck in 3 months.   Pt declines pnumonia/tdap/flu. Patient is aware of the risk of not accepting vaccines.  Right ankle sprain-reassured patient that on exam today her findings seemed normal. Likely  there is some residual weakness of her right ankle from 6 sprain 6 weeks ago. Gave handout with exercises to help strengthen her ankle. Discussed that with exertion she wear a supportive ankle brace. Can use anti-inflammatory as needed as well as elevation.  Right wrist pain/weakness- I certainly do think there is some weakness in her right wrist with heavy objects but on physical exam her strength was 5 out of 5 with my resistance. Will get x-ray today just to look at the points of tenderness on exam. I do not suspect to find any acute findings. 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 Could consider referral to sports medicine doctor here in office and they may consider injection.  Hyperlipidemia- will recheck today and adjust medication accordingly.   Muscle cramps- since muscle cramps seem to be associated with statin use will check CK level today. Discuss with patient other causes for muscle cramps. Recommended taking the regular Alka-Seltzer tablets at onset of cramps to see if helps. Will check magnesium level today as well.  Chronic cystitis- managed by urology.   Vitamin d deficiency- we'll check level today and see if patient needs to be on high-dose or if we can taper to T3 over-the-counter.

## 2015-05-20 NOTE — Telephone Encounter (Signed)
Done

## 2015-05-21 LAB — HIV ANTIBODY (ROUTINE TESTING W REFLEX): HIV 1&2 Ab, 4th Generation: NONREACTIVE

## 2015-05-21 LAB — COMPLETE METABOLIC PANEL WITH GFR
ALBUMIN: 4.2 g/dL (ref 3.6–5.1)
ALK PHOS: 69 U/L (ref 33–130)
ALT: 27 U/L (ref 6–29)
AST: 21 U/L (ref 10–35)
BILIRUBIN TOTAL: 0.5 mg/dL (ref 0.2–1.2)
BUN: 12 mg/dL (ref 7–25)
CALCIUM: 9.5 mg/dL (ref 8.6–10.4)
CO2: 22 mmol/L (ref 20–31)
CREATININE: 0.68 mg/dL (ref 0.50–1.05)
Chloride: 103 mmol/L (ref 98–110)
GFR, Est African American: >89 mL/min (ref >=60)
GFR, Est Non African American: >89 mL/min (ref >=60)
Glucose, Bld: 116 mg/dL — ABNORMAL HIGH (ref 65–99)
Potassium: 4.1 mmol/L (ref 3.5–5.3)
Sodium: 137 mmol/L (ref 135–146)
Total Protein: 6.9 g/dL (ref 6.1–8.1)

## 2015-05-21 LAB — IBC PANEL
%SAT: 25 % (ref 11–50)
TIBC: 381 ug/dL (ref 250–450)
UIBC: 287 ug/dL (ref 125–400)

## 2015-05-21 LAB — OTHER SOLSTAS TEST

## 2015-05-21 LAB — IRON: Iron: 94 ug/dL (ref 45–160)

## 2015-05-21 LAB — MAGNESIUM: MAGNESIUM: 1.7 mg/dL (ref 1.5–2.5)

## 2015-05-21 LAB — LIPID PANEL
CHOLESTEROL: 234 mg/dL — AB (ref 125–200)
HDL: 47 mg/dL (ref >=46)
LDL Cholesterol: 135 mg/dL — ABNORMAL HIGH (ref <130)
Total CHOL/HDL Ratio: 5 Ratio (ref <=5.0)
Triglycerides: 261 mg/dL — ABNORMAL HIGH (ref <150)
VLDL: 52 mg/dL — ABNORMAL HIGH (ref <30)

## 2015-05-21 LAB — VITAMIN D 25 HYDROXY (VIT D DEFICIENCY, FRACTURES): Vit D, 25-Hydroxy: 59 ng/mL (ref 30–100)

## 2015-05-21 LAB — HEPATITIS C ANTIBODY: HCV Ab: NEGATIVE

## 2015-05-21 LAB — CK: Total CK: 101 U/L (ref 7–177)

## 2015-05-23 ENCOUNTER — Other Ambulatory Visit: Payer: Self-pay | Admitting: Physician Assistant

## 2015-05-23 ENCOUNTER — Other Ambulatory Visit: Payer: Self-pay | Admitting: *Deleted

## 2015-05-23 MED ORDER — VITAMIN D (ERGOCALCIFEROL) 1.25 MG (50000 UNIT) PO CAPS: ORAL_CAPSULE | ORAL | Status: DC

## 2015-07-15 ENCOUNTER — Telehealth: Payer: Self-pay

## 2015-07-15 DIAGNOSIS — R319 Hematuria, unspecified: Secondary | ICD-10-CM

## 2015-07-15 DIAGNOSIS — R809 Proteinuria, unspecified: Secondary | ICD-10-CM

## 2015-07-15 DIAGNOSIS — N390 Urinary tract infection, site not specified: Secondary | ICD-10-CM

## 2015-07-15 NOTE — Telephone Encounter (Signed)
Patient needs a referral to [facility].

## 2015-08-15 ENCOUNTER — Other Ambulatory Visit: Payer: Self-pay | Admitting: Physician Assistant

## 2015-08-19 ENCOUNTER — Ambulatory Visit: Payer: 59 | Admitting: Physician Assistant

## 2015-08-27 ENCOUNTER — Encounter: Payer: Self-pay | Admitting: Physician Assistant

## 2015-08-27 ENCOUNTER — Ambulatory Visit (INDEPENDENT_AMBULATORY_CARE_PROVIDER_SITE_OTHER): Payer: 59 | Admitting: Physician Assistant

## 2015-08-27 VITALS — BP 129/78 | HR 107 | Ht 62.5 in | Wt 227.0 lb

## 2015-08-27 DIAGNOSIS — E538 Deficiency of other specified B group vitamins: Secondary | ICD-10-CM

## 2015-08-27 DIAGNOSIS — E785 Hyperlipidemia, unspecified: Secondary | ICD-10-CM

## 2015-08-27 DIAGNOSIS — E559 Vitamin D deficiency, unspecified: Secondary | ICD-10-CM | POA: Insufficient documentation

## 2015-08-27 DIAGNOSIS — Z13220 Encounter for screening for lipoid disorders: Secondary | ICD-10-CM

## 2015-08-27 DIAGNOSIS — E119 Type 2 diabetes mellitus without complications: Secondary | ICD-10-CM

## 2015-08-27 LAB — POCT GLYCOSYLATED HEMOGLOBIN (HGB A1C): Hemoglobin A1C: 6.3

## 2015-08-27 NOTE — Progress Notes (Signed)
   Subjective:    Patient ID: Tina Mcgrath, female    DOB: 1955/11/09, 59 y.o.   MRN: [id number]  HPI Pt is a 59 yo female who presents to the clinic for 3 month follow up on DM. She is not checking sugars. She is on metformin only. She denies any vision changes, neuropathy, non-healing wounds. She has recent eye exam.   She is doing much better with livalo. She is up to 4 times a week. Her muscle cramps are very controlled. She is using a magnesium spray at night that is really helping.   She is off vitamin b12 but wonders if need to be on a something but not as high as before.   She is taking vitamin D weekly. She would like rechecked at some point.    Review of Systems  All other systems reviewed and are negative.      Objective:   Physical Exam  Constitutional: She is oriented to person, place, and time. She appears well-developed and well-nourished.  HENT:  Head: Normocephalic and atraumatic.  Cardiovascular: Normal rate, regular rhythm and normal heart sounds.   Pulmonary/Chest: Effort normal and breath sounds normal.  Neurological: She is alert and oriented to person, place, and time.  Skin: Skin is dry.  Psychiatric: She has a normal mood and affect. Her behavior is normal.          Assessment & Plan:  Diabetes type II, controlled- .[name] Lab Results  Component Value Date   HGBA1C 6.3 08/27/2015   Last check was 6.1 up to 6.3 today.  Recent eye exam done.  Has pneumonia and flu shots.  Discussed a little increase. Continue metformin and encouraged to watch sugar and carbs in diet.   Does not need refills today but ok for when she calls in.   Hyperlipidemia- labs printed to get before 3 month follow up. Continue on livalo 4 times a week or more if tolerated.   b12- will check before 3 months follow up. Last recheck was 457 and great. On nothing right now.   Vitamin D deficiency- orderd to check before next 3 month follow up.

## 2015-09-14 ENCOUNTER — Other Ambulatory Visit: Payer: Self-pay | Admitting: Physician Assistant

## 2015-09-16 ENCOUNTER — Telehealth: Payer: Self-pay | Admitting: Physician Assistant

## 2015-09-16 NOTE — Telephone Encounter (Signed)
Received for prior authorization on Livalo sent through cover my meds waiting on authorization. - CF

## 2015-10-06 ENCOUNTER — Encounter: Payer: Self-pay | Admitting: Physician Assistant

## 2015-10-06 ENCOUNTER — Ambulatory Visit (INDEPENDENT_AMBULATORY_CARE_PROVIDER_SITE_OTHER): Payer: BLUE CROSS/BLUE SHIELD | Admitting: Physician Assistant

## 2015-10-06 VITALS — BP 155/89 | HR 114 | Temp 98.2°F | Ht 62.5 in | Wt 228.0 lb

## 2015-10-06 DIAGNOSIS — J45901 Unspecified asthma with (acute) exacerbation: Secondary | ICD-10-CM

## 2015-10-06 MED ORDER — AZITHROMYCIN 250 MG PO TABS: ORAL_TABLET | ORAL | Status: DC

## 2015-10-06 MED ORDER — PREDNISONE 20 MG PO TABS: ORAL_TABLET | ORAL | Status: DC

## 2015-10-06 MED ORDER — BENZONATATE 200 MG PO CAPS: 200.0000 mg | ORAL_CAPSULE | Freq: Two times a day (BID) | ORAL | Status: DC | PRN

## 2015-10-06 MED ORDER — HYDROCODONE-HOMATROPINE 5-1.5 MG/5ML PO SYRP: 5.0000 mL | ORAL_SOLUTION | Freq: Every evening | ORAL | Status: DC | PRN

## 2015-10-06 NOTE — Progress Notes (Signed)
   Subjective:    Patient ID: Tina Mcgrath, female    DOB: May 28, 1956, 60 y.o.   MRN: [id number]  HPI  Pt is a 60 yo female who presents to the clinic with productive barking cough since Saturday, for 3 days. She has yellow to green phelm. She is also having wheezing with cough. No fever, chills, n/v/d, body aches, sinus pressure, ear pain, or ST. She has tried singular without benefit. In the past she had been given advair for these exacerbations. She only has wheezing with sickness. She does not have ongoing asthma.    Review of Systems  All other systems reviewed and are negative.      Objective:   Physical Exam  Constitutional: She is oriented to person, place, and time. She appears well-developed and well-nourished.  HENT:  Head: Normocephalic and atraumatic.  Cardiovascular: Normal rate, regular rhythm and normal heart sounds.   Pulmonary/Chest: Effort normal and breath sounds normal.  Neurological: She is alert and oriented to person, place, and time.  Psychiatric: She has a normal mood and affect. Her behavior is normal.          Assessment & Plan:  Asthmatic bronchitis- treated with prednisone and zpak. tessalone for cough during the day and hycodan for cough at night. Follow up if not improving. May need to consider Advair if cough continues.

## 2015-10-07 ENCOUNTER — Encounter: Payer: Self-pay | Admitting: Physician Assistant

## 2015-10-15 NOTE — Telephone Encounter (Signed)
Received approval for Livalo good from 09/16/15-09/05/2038. - CF

## 2015-10-24 ENCOUNTER — Telehealth: Payer: Self-pay | Admitting: *Deleted

## 2015-10-24 ENCOUNTER — Other Ambulatory Visit: Payer: Self-pay | Admitting: Physician Assistant

## 2015-10-24 MED ORDER — HYDROCODONE-HOMATROPINE 5-1.5 MG/5ML PO SYRP: 5.0000 mL | ORAL_SOLUTION | Freq: Every evening | ORAL | Status: DC | PRN

## 2015-10-24 MED ORDER — GUAIFENESIN-CODEINE 100-10 MG/5ML PO SYRP: 5.0000 mL | ORAL_SOLUTION | Freq: Three times a day (TID) | ORAL | Status: DC | PRN

## 2015-10-24 MED ORDER — LEVOFLOXACIN 500 MG PO TABS: 500.0000 mg | ORAL_TABLET | Freq: Every day | ORAL | Status: DC

## 2015-10-24 NOTE — Telephone Encounter (Signed)
ok 

## 2015-10-24 NOTE — Telephone Encounter (Signed)
Printed hycodan. Will have to pick up here or [facility].

## 2015-10-24 NOTE — Telephone Encounter (Signed)
Pt left a vm stating that she is still having a really bad cough.  She stated that she's been using Mucinex and it only helps for a couple hours.  She wants to know if she could have some more cough syrup.  Please advise.

## 2015-10-24 NOTE — Telephone Encounter (Signed)
Pt is ok with levaquin & she would prefer cheratussin instead of hycodan.

## 2015-10-31 ENCOUNTER — Other Ambulatory Visit: Payer: Self-pay | Admitting: Physician Assistant

## 2015-11-03 ENCOUNTER — Other Ambulatory Visit: Payer: Self-pay | Admitting: *Deleted

## 2015-11-03 MED ORDER — PITAVASTATIN CALCIUM 2 MG PO TABS: ORAL_TABLET | ORAL | Status: DC

## 2015-11-03 MED ORDER — METFORMIN HCL 500 MG PO TABS: 500.0000 mg | ORAL_TABLET | Freq: Two times a day (BID) | ORAL | Status: DC

## 2015-11-04 ENCOUNTER — Ambulatory Visit (INDEPENDENT_AMBULATORY_CARE_PROVIDER_SITE_OTHER): Payer: BLUE CROSS/BLUE SHIELD | Admitting: Physician Assistant

## 2015-11-04 ENCOUNTER — Ambulatory Visit (INDEPENDENT_AMBULATORY_CARE_PROVIDER_SITE_OTHER): Payer: BLUE CROSS/BLUE SHIELD

## 2015-11-04 ENCOUNTER — Encounter: Payer: Self-pay | Admitting: Physician Assistant

## 2015-11-04 VITALS — BP 121/65 | HR 106 | Ht 62.5 in | Wt 225.0 lb

## 2015-11-04 DIAGNOSIS — C50911 Malignant neoplasm of unspecified site of right female breast: Secondary | ICD-10-CM

## 2015-11-04 DIAGNOSIS — R05 Cough: Secondary | ICD-10-CM

## 2015-11-04 DIAGNOSIS — Z853 Personal history of malignant neoplasm of breast: Secondary | ICD-10-CM | POA: Insufficient documentation

## 2015-11-04 DIAGNOSIS — R058 Other specified cough: Secondary | ICD-10-CM | POA: Insufficient documentation

## 2015-11-04 DIAGNOSIS — R059 Cough, unspecified: Secondary | ICD-10-CM

## 2015-11-04 HISTORY — DX: Personal history of malignant neoplasm of breast: Z85.3

## 2015-11-04 MED ORDER — BENZONATATE 200 MG PO CAPS: 200.0000 mg | ORAL_CAPSULE | Freq: Two times a day (BID) | ORAL | Status: DC | PRN

## 2015-11-04 MED ORDER — MONTELUKAST SODIUM 10 MG PO TABS: 10.0000 mg | ORAL_TABLET | Freq: Every day | ORAL | Status: DC

## 2015-11-04 MED ORDER — FLUTICASONE FUROATE-VILANTEROL 100-25 MCG/INH IN AEPB: 1.0000 | INHALATION_SPRAY | Freq: Every day | RESPIRATORY_TRACT | Status: DC

## 2015-11-04 NOTE — Progress Notes (Signed)
   Subjective:    Patient ID: Tina Mcgrath, female    DOB: 02-17-1956, 60 y.o.   MRN: [id number]  HPI Pt presents to the clinic with dry cough for last 5 weeks. Seen in office 10/06/15 for asthmatic bronchitis and treated with zpak and prednisone. Pt felt better but cough continued. Sent levaquin and cherratussin and tessalon pearles over.she did not tolerate cough syrup made her feel weird. She continues to have cough. No sinus pressure, ear pain, wheezing or SOB. She had something similar to this a few years ago after URI. She was treated with singular and advair and symptoms resolved. Denies any acid reflux symptoms. She has been on ace since 05/2015 without any problems.    Review of Systems  All other systems reviewed and are negative.      Objective:   Physical Exam  Constitutional: She is oriented to person, place, and time. She appears well-developed and well-nourished.  HENT:  Head: Normocephalic and atraumatic.  Right Ear: External ear normal.  Left Ear: External ear normal.  Nose: Nose normal.  Mouth/Throat: Oropharynx is clear and moist. No oropharyngeal exudate.  Eyes: Conjunctivae are normal. Right eye exhibits no discharge. Left eye exhibits no discharge.  Neck: Normal range of motion. Neck supple.  Cardiovascular: Normal rate, regular rhythm and normal heart sounds.   Pulmonary/Chest: Effort normal and breath sounds normal. She has no wheezes.  Lymphadenopathy:    She has no cervical adenopathy.  Neurological: She is alert and oriented to person, place, and time.  Psychiatric: She has a normal mood and affect. Her behavior is normal.          Assessment & Plan:  Post-infectious cough- pulse ox reassuring at 99 percent. started BREO one puff and day and Singulair. Discussed OTC Pelargonium with honey for cough. Refilled tessalon pearles. CXR was negative for any infectious cause of cough. Been on lisinopril but has been on since 05/2015 without any problems. Will see if  symptoms persist then try trial without. Follow up in one month.

## 2015-11-04 NOTE — Patient Instructions (Addendum)
Pelargonium- [place] cold care for cough.   1.2g   [facility]  Cough, Adult Coughing is a reflex that clears your throat and your airways. Coughing helps to heal and protect your lungs. It is normal to cough occasionally, but a cough that happens with other symptoms or lasts a long time may be a sign of a condition that needs treatment. A cough may last only 2-3 weeks (acute), or it may last longer than 8 weeks (chronic). CAUSES Coughing is commonly caused by:  Breathing in substances that irritate your lungs.  A viral or bacterial respiratory infection.  Allergies.  Asthma.  Postnasal drip.  Smoking.  Acid backing up from the stomach into the esophagus (gastroesophageal reflux).  Certain medicines.  Chronic lung problems, including COPD (or rarely, lung cancer).  Other medical conditions such as heart failure. HOME CARE INSTRUCTIONS  Pay attention to any changes in your symptoms. Take these actions to help with your discomfort:  Take medicines only as told by your health care provider.  If you were prescribed an antibiotic medicine, take it as told by your health care provider. Do not stop taking the antibiotic even if you start to feel better.  Talk with your health care provider before you take a cough suppressant medicine.  Drink enough fluid to keep your urine clear or pale yellow.  If the air is dry, use a cold steam vaporizer or humidifier in your bedroom or your home to help loosen secretions.  Avoid anything that causes you to cough at work or at home.  If your cough is worse at night, try sleeping in a semi-upright position.  Avoid cigarette smoke. If you smoke, quit smoking. If you need help quitting, ask your health care provider.  Avoid caffeine.  Avoid alcohol.  Rest as needed. SEEK MEDICAL CARE IF:   You have new symptoms.  You cough up pus.  Your cough does not get better after 2-3 weeks, or your cough gets worse.  You cannot control  your cough with suppressant medicines and you are losing sleep.  You develop pain that is getting worse or pain that is not controlled with pain medicines.  You have a fever.  You have unexplained weight loss.  You have night sweats. SEEK IMMEDIATE MEDICAL CARE IF:  You cough up blood.  You have difficulty breathing.  Your heartbeat is very fast.   This information is not intended to replace advice given to you by your health care provider. Make sure you discuss any questions you have with your health care provider.   Document Released: 02/19/2011 Document Revised: 05/14/2015 Document Reviewed: 10/30/2014 Elsevier Interactive Patient Education [employer].

## 2015-11-05 ENCOUNTER — Encounter: Payer: Self-pay | Admitting: Physician Assistant

## 2015-11-10 ENCOUNTER — Other Ambulatory Visit: Payer: Self-pay | Admitting: *Deleted

## 2015-11-10 MED ORDER — PITAVASTATIN CALCIUM 2 MG PO TABS
ORAL_TABLET | ORAL | Status: DC
Start: 2015-11-10 — End: 2017-09-23

## 2015-11-12 ENCOUNTER — Telehealth: Payer: Self-pay | Admitting: *Deleted

## 2015-11-12 NOTE — Telephone Encounter (Signed)
Initiated a PA on Livalo through covermymeds. Called patient to see what statins she has tried and failed in the past. Patient confirmed she has taken simvastatin,pravastatin, atorvastatin.[name] Key # [id number]

## 2015-11-20 LAB — LIPID PANEL
Cholesterol: 203 mg/dL — ABNORMAL HIGH (ref 125–200)
HDL: 48 mg/dL (ref >=46)
LDL Cholesterol: 116 mg/dL (ref <130)
Total CHOL/HDL Ratio: 4.2 Ratio (ref <=5.0)
Triglycerides: 193 mg/dL — ABNORMAL HIGH (ref <150)
VLDL: 39 mg/dL — ABNORMAL HIGH (ref <30)

## 2015-11-20 LAB — VITAMIN B12: Vitamin B-12: 522 pg/mL (ref 200–1100)

## 2015-11-20 LAB — VITAMIN D 25 HYDROXY (VIT D DEFICIENCY, FRACTURES): Vit D, 25-Hydroxy: 77 ng/mL (ref 30–100)

## 2015-11-20 LAB — COMPLETE METABOLIC PANEL WITH GFR
ALBUMIN: 4 g/dL (ref 3.6–5.1)
ALK PHOS: 59 U/L (ref 33–130)
ALT: 31 U/L — AB (ref 6–29)
AST: 22 U/L (ref 10–35)
BILIRUBIN TOTAL: 0.6 mg/dL (ref 0.2–1.2)
BUN: 14 mg/dL (ref 7–25)
CO2: 23 mmol/L (ref 20–31)
CREATININE: 0.73 mg/dL (ref 0.50–1.05)
Calcium: 9.2 mg/dL (ref 8.6–10.4)
Chloride: 101 mmol/L (ref 98–110)
GFR, Est African American: >89 mL/min (ref >=60)
GFR, Est Non African American: >89 mL/min (ref >=60)
GLUCOSE: 138 mg/dL — AB (ref 65–99)
Potassium: 4.4 mmol/L (ref 3.5–5.3)
SODIUM: 138 mmol/L (ref 135–146)
TOTAL PROTEIN: 6.9 g/dL (ref 6.1–8.1)

## 2015-11-21 ENCOUNTER — Encounter: Payer: Self-pay | Admitting: Physician Assistant

## 2015-11-21 DIAGNOSIS — R7303 Prediabetes: Secondary | ICD-10-CM | POA: Insufficient documentation

## 2015-11-26 ENCOUNTER — Ambulatory Visit: Payer: 59 | Admitting: Physician Assistant

## 2015-11-26 NOTE — Telephone Encounter (Signed)
Your request has been approved  Effective from 11/12/2015 through 09/05/2038.

## 2015-11-28 ENCOUNTER — Other Ambulatory Visit: Payer: Self-pay | Admitting: Physician Assistant

## 2015-12-01 ENCOUNTER — Encounter: Payer: Self-pay | Admitting: Physician Assistant

## 2015-12-01 ENCOUNTER — Ambulatory Visit (INDEPENDENT_AMBULATORY_CARE_PROVIDER_SITE_OTHER): Payer: BLUE CROSS/BLUE SHIELD | Admitting: Physician Assistant

## 2015-12-01 VITALS — BP 134/84 | HR 102 | Ht 62.5 in | Wt 226.0 lb

## 2015-12-01 DIAGNOSIS — R05 Cough: Secondary | ICD-10-CM | POA: Diagnosis not present

## 2015-12-01 DIAGNOSIS — E119 Type 2 diabetes mellitus without complications: Secondary | ICD-10-CM

## 2015-12-01 DIAGNOSIS — R059 Cough, unspecified: Secondary | ICD-10-CM

## 2015-12-01 LAB — POCT UA - MICROALBUMIN
Creatinine, POC: 200 mg/dL
MICROALBUMIN (UR) POC: 150 mg/L

## 2015-12-01 LAB — POCT GLYCOSYLATED HEMOGLOBIN (HGB A1C): HEMOGLOBIN A1C: 6.4

## 2015-12-01 MED ORDER — METFORMIN HCL 500 MG PO TABS: 500.0000 mg | ORAL_TABLET | Freq: Two times a day (BID) | ORAL | Status: DC

## 2015-12-01 MED ORDER — LEVOCETIRIZINE DIHYDROCHLORIDE 5 MG PO TABS: 5.0000 mg | ORAL_TABLET | Freq: Every evening | ORAL | Status: DC

## 2015-12-01 NOTE — Progress Notes (Signed)
   Subjective:    Patient ID: Tina Mcgrath, female    DOB: [birth year], 60 y.o.   MRN: [id number]  HPI Pt presents to the clinic to follow up on DM. She is not checking her sugars. She feels like they may be more elevated due to her abx and steroid usage over past 3 months. No open sores or wounds. No hypoglycemic events. Feeling pretty good. Taking metformin as directed with ACE and STATIN. She only takes livalo every other to 3 times a week due to side effects.   She continues to have dry cough. No fever, chills, SOB or wheezing. She does feel like improved. Has had abx, prednisone, cough suppressants, BREO, singulair, claritin.    Review of Systems  All other systems reviewed and are negative.      Objective:   Physical Exam  Constitutional: She is oriented to person, place, and time. She appears well-developed and well-nourished.  Obese.   HENT:  Head: Normocephalic and atraumatic.  Cardiovascular: Normal rate, regular rhythm and normal heart sounds.   Pulmonary/Chest: Effort normal and breath sounds normal. She has no wheezes.  Dry hacking cough.  Neurological: She is alert and oriented to person, place, and time.  Psychiatric: She has a normal mood and affect. Her behavior is normal.          Assessment & Plan:  DM, type II, with protienuria-  .. Results for orders placed or performed in visit on 12/01/15  POCT HgB A1C  Result Value Ref Range   Hemoglobin A1C 6.4   POCT UA - Microalbumin  Result Value Ref Range   Microalbumin Ur, POC 150 mg/L   Creatinine, POC 200 mg/dL   Albumin/Creatinine Ratio, Urine, POC 30-300    microalbumin stable.  On ACE. On STATIN.  Discussed weight loss and diabetic diet.  Continue metformin bid.  Follow up in 3 months.   Cough- improved but dry and hacking. Continue to think more allergic and/or post viral.Will try stopping lisinopril for next 2 weeks. Call back if improved. Mention acid reflux. Pt does not feel like this could be it.  She does not want to go back on PPI. Discussed zantac 150-300mg  at dinner. On claritin. Consider switching to xyzal with singular to see if helps any allergy component. Continue bREO. Already had a round of prednisone. Suck on hard candy. Follow up in 4-6 weeks.    Obesity- discussed medcations to help with losing weight. She is resistent to this plan. Discussed exercise daily. Pt could be not eating enough. Encouraged small meals throughout the day.

## 2016-01-26 LAB — HM MAMMOGRAPHY

## 2016-02-04 ENCOUNTER — Encounter: Payer: Self-pay | Admitting: Physician Assistant

## 2016-03-02 ENCOUNTER — Ambulatory Visit (INDEPENDENT_AMBULATORY_CARE_PROVIDER_SITE_OTHER): Payer: BLUE CROSS/BLUE SHIELD | Admitting: Physician Assistant

## 2016-03-02 ENCOUNTER — Encounter: Payer: Self-pay | Admitting: Physician Assistant

## 2016-03-02 VITALS — BP 133/66 | HR 96 | Ht 62.5 in | Wt 231.0 lb

## 2016-03-02 DIAGNOSIS — R05 Cough: Secondary | ICD-10-CM

## 2016-03-02 DIAGNOSIS — E119 Type 2 diabetes mellitus without complications: Secondary | ICD-10-CM | POA: Diagnosis not present

## 2016-03-02 DIAGNOSIS — E669 Obesity, unspecified: Secondary | ICD-10-CM

## 2016-03-02 DIAGNOSIS — E6609 Other obesity due to excess calories: Secondary | ICD-10-CM | POA: Insufficient documentation

## 2016-03-02 DIAGNOSIS — R059 Cough, unspecified: Secondary | ICD-10-CM | POA: Insufficient documentation

## 2016-03-02 DIAGNOSIS — Z6831 Body mass index (BMI) 31.0-31.9, adult: Secondary | ICD-10-CM | POA: Insufficient documentation

## 2016-03-02 HISTORY — DX: Obesity, unspecified: E66.9

## 2016-03-02 LAB — POCT GLYCOSYLATED HEMOGLOBIN (HGB A1C): HEMOGLOBIN A1C: 6.4

## 2016-03-02 MED ORDER — FLUTICASONE FUROATE-VILANTEROL 100-25 MCG/INH IN AEPB: 1.0000 | INHALATION_SPRAY | Freq: Every day | RESPIRATORY_TRACT | Status: DC

## 2016-03-02 MED ORDER — LISINOPRIL 2.5 MG PO TABS: 2.5000 mg | ORAL_TABLET | Freq: Every day | ORAL | Status: DC

## 2016-03-02 NOTE — Progress Notes (Signed)
   Subjective:    Patient ID: Tina Mcgrath, female    DOB: 1956-03-01, 60 y.o.   MRN: [id number]  HPI Pt is a 60 yo female who presents to the clinic for 3 month follow up.   Cough- much improved. Stop BREO 1 month ago. Pt has felt like cough is starting to come back. No fever, chills, wheezing.   DM- taking metformin daily. Not checking sugars. No hypoglycemia.   Obesity- walking 2 times a day. Trying to eat more veggies and less meat. No change in weight. Has been gaining.    Review of Systems  All other systems reviewed and are negative.      Objective:   Physical Exam  Constitutional: She is oriented to person, place, and time. She appears well-developed and well-nourished.  Obesity.   HENT:  Head: Normocephalic and atraumatic.  Cardiovascular: Normal rate, regular rhythm and normal heart sounds.   Pulmonary/Chest: Effort normal and breath sounds normal. She has no wheezes.  Neurological: She is alert and oriented to person, place, and time.  Psychiatric: She has a normal mood and affect. Her behavior is normal.          Assessment & Plan:  DM type II- .[name] Lab Results  Component Value Date   HGBA1C 6.4 03/02/2016  unchanged at 6.4. Good control.  Continue metformin.  Will restart ace.  On STATIN.  Follow up in 3 months.    Cough- much improved. Since started to come back after stopping BREO. Restart BREO. Stay on claritin and singulair. Consider spironmetry for more formal lung diagnosis.   Obesity- discussed medications. Pt declined today. Continue exercise and diet changes.

## 2016-04-09 ENCOUNTER — Encounter: Payer: Self-pay | Admitting: Physician Assistant

## 2016-04-09 ENCOUNTER — Ambulatory Visit (INDEPENDENT_AMBULATORY_CARE_PROVIDER_SITE_OTHER): Payer: BLUE CROSS/BLUE SHIELD | Admitting: Physician Assistant

## 2016-04-09 VITALS — BP 139/68 | HR 90 | Temp 97.9°F | Wt 229.0 lb

## 2016-04-09 DIAGNOSIS — T148 Other injury of unspecified body region: Secondary | ICD-10-CM

## 2016-04-09 DIAGNOSIS — W57XXXA Bitten or stung by nonvenomous insect and other nonvenomous arthropods, initial encounter: Secondary | ICD-10-CM | POA: Diagnosis not present

## 2016-04-09 DIAGNOSIS — R21 Rash and other nonspecific skin eruption: Secondary | ICD-10-CM

## 2016-04-09 MED ORDER — CLOTRIMAZOLE-BETAMETHASONE 1-0.05 % EX CREA: 1.0000 "application " | TOPICAL_CREAM | Freq: Two times a day (BID) | CUTANEOUS | 0 refills | Status: DC

## 2016-04-09 NOTE — Patient Instructions (Signed)
Use cream for 1-2 weeks until rash gone.

## 2016-04-12 ENCOUNTER — Encounter: Payer: Self-pay | Admitting: Physician Assistant

## 2016-04-12 LAB — LYME AB/WESTERN BLOT REFLEX

## 2016-04-12 NOTE — Progress Notes (Signed)
   Subjective:    Patient ID: Tina Mcgrath, female    DOB: August 20, 1956, 60 y.o.   MRN: [id number]  HPI Pt presents today with a rash of upper right abdomen. Notice for the last week. Approximately 2 weeks ago she did pull a tick off around that area. They have been camping recently. Rash itches when touched but no pain or constant itch. Not tried anything to make better. No fever, chills, nausea, vomiting, diarrhea.   Review of Systems  All other systems reviewed and are negative.      Objective:   Physical Exam  Skin:  Right upper abdomen: 6cm by 4cm macular erythema with central clearing. No raised lesions.           Assessment & Plan:  Rash/tick bite/tick bite- rash has some qualities of erythema migrans. Will check lyme disease. Pt cannot tolerate doxycycline to treat. Rash has some qualities of fungal infection. lotrisone cream given bid to use. Follow up as needed.

## 2016-04-21 ENCOUNTER — Other Ambulatory Visit: Payer: Self-pay | Admitting: Physician Assistant

## 2016-04-22 NOTE — Telephone Encounter (Signed)
Did you want patient to continue taking Singulair. There isn't a diagnosis for this medication.

## 2016-06-01 LAB — HM DIABETES EYE EXAM

## 2016-08-23 ENCOUNTER — Other Ambulatory Visit: Payer: Self-pay | Admitting: Physician Assistant

## 2016-08-23 DIAGNOSIS — G5623 Lesion of ulnar nerve, bilateral upper limbs: Secondary | ICD-10-CM | POA: Insufficient documentation

## 2016-08-23 DIAGNOSIS — G5603 Carpal tunnel syndrome, bilateral upper limbs: Secondary | ICD-10-CM | POA: Insufficient documentation

## 2016-08-25 ENCOUNTER — Encounter: Payer: Self-pay | Admitting: Physician Assistant

## 2016-08-25 ENCOUNTER — Ambulatory Visit (INDEPENDENT_AMBULATORY_CARE_PROVIDER_SITE_OTHER): Payer: BLUE CROSS/BLUE SHIELD | Admitting: Physician Assistant

## 2016-08-25 VITALS — BP 120/80 | HR 118 | Ht 62.5 in | Wt 233.0 lb

## 2016-08-25 DIAGNOSIS — E119 Type 2 diabetes mellitus without complications: Secondary | ICD-10-CM | POA: Diagnosis not present

## 2016-08-25 DIAGNOSIS — H40003 Preglaucoma, unspecified, bilateral: Secondary | ICD-10-CM | POA: Insufficient documentation

## 2016-08-25 LAB — POCT GLYCOSYLATED HEMOGLOBIN (HGB A1C): HEMOGLOBIN A1C: 6.6

## 2016-08-25 MED ORDER — METFORMIN HCL 500 MG PO TABS: 500.0000 mg | ORAL_TABLET | Freq: Two times a day (BID) | ORAL | 2 refills | Status: DC

## 2016-08-25 MED ORDER — LISINOPRIL 2.5 MG PO TABS: 2.5000 mg | ORAL_TABLET | Freq: Every day | ORAL | 5 refills | Status: DC

## 2016-08-25 NOTE — Progress Notes (Signed)
   Subjective:    Patient ID: Tina Mcgrath, female    DOB: [birth year], 60 y.o.   MRN: [id number]  HPI Pt is a 60 yo female who presents to the clinic for 6 month follow up on DM, type II.  She is not checking her sugars. She takes only metformin. She admits to not exercising. She tries to watch her diet. She denies any hypoglycemic events. She denies any open wounds or sores. She recently went to eye doctor and brings in paperwork.   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    Review of Systems  All other systems reviewed and are negative.      Objective:   Physical Exam  Constitutional: She is oriented to person, place, and time. She appears well-developed and well-nourished.  HENT:  Head: Normocephalic and atraumatic.  Neck: Normal range of motion. Neck supple.  Cardiovascular: Regular rhythm and normal heart sounds.   Tachycardia rechecked at 103.   Pulmonary/Chest: Effort normal and breath sounds normal.  Lymphadenopathy:    She has no cervical adenopathy.  Neurological: She is alert and oriented to person, place, and time.  Psychiatric: She has a normal mood and affect. Her behavior is normal.          Assessment & Plan:  [name] KitchenMarland KitchenLeva was seen today for diabetes.  Diagnoses and all orders for this visit:  Controlled type 2 diabetes mellitus without complication, without long-term current use of insulin (HCC) -     POCT HgB A1C  Glaucoma suspect of both eyes  Other orders -     Discontinue: metFORMIN (GLUCOPHAGE) 500 MG tablet; Take 1 tablet (500 mg total) by mouth 2 (two) times daily with a meal. -     Discontinue: lisinopril (PRINIVIL,ZESTRIL) 2.5 MG tablet; Take 1 tablet (2.5 mg total) by mouth daily.   .. Lab Results  Component Value Date   HGBA1C 6.6 08/25/2016   6.6 controlled but not quite as good as 6 months ago.  Eye exam done. No diabetic changes but following closely for glaucoma.  Refilled metformin.  Follow up in 3 months.      .. Results for orders placed or performed in visit on 08/25/16  POCT HgB A1C  Result Value Ref Range   Hemoglobin A1C 6.6    A!C up from 6.4 6 months ago.

## 2016-08-26 ENCOUNTER — Encounter: Payer: Self-pay | Admitting: Physician Assistant

## 2016-08-26 ENCOUNTER — Other Ambulatory Visit: Payer: Self-pay

## 2016-08-26 MED ORDER — METFORMIN HCL 500 MG PO TABS: 500.0000 mg | ORAL_TABLET | Freq: Two times a day (BID) | ORAL | 2 refills | Status: DC

## 2016-08-26 MED ORDER — LISINOPRIL 2.5 MG PO TABS: 2.5000 mg | ORAL_TABLET | Freq: Every day | ORAL | 5 refills | Status: DC

## 2016-08-31 ENCOUNTER — Encounter: Payer: Self-pay | Admitting: Physician Assistant

## 2016-11-18 ENCOUNTER — Telehealth: Payer: Self-pay | Admitting: *Deleted

## 2016-11-18 DIAGNOSIS — E119 Type 2 diabetes mellitus without complications: Secondary | ICD-10-CM

## 2016-11-18 DIAGNOSIS — E559 Vitamin D deficiency, unspecified: Secondary | ICD-10-CM

## 2016-11-18 DIAGNOSIS — E785 Hyperlipidemia, unspecified: Secondary | ICD-10-CM

## 2016-11-18 NOTE — Telephone Encounter (Signed)
Labs ordered for upcoming CPE. 

## 2016-11-24 LAB — COMPLETE METABOLIC PANEL WITH GFR
ALBUMIN: 4.1 g/dL (ref 3.6–5.1)
ALK PHOS: 56 U/L (ref 33–130)
ALT: 45 U/L — ABNORMAL HIGH (ref 6–29)
AST: 32 U/L (ref 10–35)
BILIRUBIN TOTAL: 0.6 mg/dL (ref 0.2–1.2)
BUN: 9 mg/dL (ref 7–25)
CO2: 22 mmol/L (ref 20–31)
Calcium: 9.2 mg/dL (ref 8.6–10.4)
Chloride: 105 mmol/L (ref 98–110)
Creat: 0.65 mg/dL (ref 0.50–1.05)
Glucose, Bld: 169 mg/dL — ABNORMAL HIGH (ref 65–99)
POTASSIUM: 4.1 mmol/L (ref 3.5–5.3)
Sodium: 138 mmol/L (ref 135–146)
TOTAL PROTEIN: 6.6 g/dL (ref 6.1–8.1)

## 2016-11-24 LAB — LIPID PANEL
CHOL/HDL RATIO: 3.9 ratio (ref <5.0)
Cholesterol: 201 mg/dL — ABNORMAL HIGH (ref <200)
HDL: 51 mg/dL (ref >50)
LDL Cholesterol: 118 mg/dL — ABNORMAL HIGH (ref <100)
TRIGLYCERIDES: 162 mg/dL — AB (ref <150)
VLDL: 32 mg/dL — ABNORMAL HIGH (ref <30)

## 2016-11-24 LAB — HEMOGLOBIN A1C
Hgb A1c MFr Bld: 6.4 % — ABNORMAL HIGH (ref <5.7)
Mean Plasma Glucose: 137 mg/dL

## 2016-11-24 LAB — VITAMIN D 25 HYDROXY (VIT D DEFICIENCY, FRACTURES): Vit D, 25-Hydroxy: 63 ng/mL (ref 30–100)

## 2016-11-26 ENCOUNTER — Ambulatory Visit (INDEPENDENT_AMBULATORY_CARE_PROVIDER_SITE_OTHER): Payer: BLUE CROSS/BLUE SHIELD | Admitting: Physician Assistant

## 2016-11-26 ENCOUNTER — Encounter: Payer: Self-pay | Admitting: Physician Assistant

## 2016-11-26 VITALS — BP 127/84 | HR 108 | Ht 62.5 in | Wt 232.0 lb

## 2016-11-26 DIAGNOSIS — Z23 Encounter for immunization: Secondary | ICD-10-CM

## 2016-11-26 DIAGNOSIS — Z0000 Encounter for general adult medical examination without abnormal findings: Secondary | ICD-10-CM

## 2016-11-26 DIAGNOSIS — R748 Abnormal levels of other serum enzymes: Secondary | ICD-10-CM | POA: Diagnosis not present

## 2016-11-26 DIAGNOSIS — Z1211 Encounter for screening for malignant neoplasm of colon: Secondary | ICD-10-CM | POA: Diagnosis not present

## 2016-11-26 DIAGNOSIS — E119 Type 2 diabetes mellitus without complications: Secondary | ICD-10-CM

## 2016-11-26 DIAGNOSIS — B351 Tinea unguium: Secondary | ICD-10-CM

## 2016-11-26 MED ORDER — CICLOPIROX 8 % EX SOLN: Freq: Every day | CUTANEOUS | 0 refills | Status: DC

## 2016-11-26 MED ORDER — LISINOPRIL 2.5 MG PO TABS: 2.5000 mg | ORAL_TABLET | Freq: Every day | ORAL | 4 refills | Status: DC

## 2016-11-26 MED ORDER — METFORMIN HCL 500 MG PO TABS: 500.0000 mg | ORAL_TABLET | Freq: Two times a day (BID) | ORAL | 2 refills | Status: DC

## 2016-11-26 NOTE — Progress Notes (Signed)
Subjective:     Tina Mcgrath is a 61 y.o. female and is here for a comprehensive physical exam. The patient reports no problems.   Social History   Social History  . Marital status: Divorced    Spouse name: N/A  . Number of children: N/A  . Years of education: N/A   Occupational History  . Not on file.   Social History Main Topics  . Smoking status: Former Research scientist (life sciences)  . Smokeless tobacco: Never Used  . Alcohol use No  . Drug use: No  . Sexual activity: No   Other Topics Concern  . Not on file   Social History Narrative  . No narrative on file   Health Maintenance  Topic Date Due  . INFLUENZA VACCINE  05/19/2017 (Originally 04/06/2016)  . PNEUMOCOCCAL POLYSACCHARIDE VACCINE (1) 08/25/2026 (Originally 08/06/1960)  . HEMOGLOBIN A1C  05/26/2017  . OPHTHALMOLOGY EXAM  06/01/2017  . PAP SMEAR  07/11/2017  . FOOT EXAM  11/26/2017  . MAMMOGRAM  01/25/2018  . COLONOSCOPY  10/18/2019  . TETANUS/TDAP  11/27/2026  . Hepatitis C Screening  Completed  . HIV Screening  Completed    The following portions of the patient's history were reviewed and updated as appropriate: allergies, current medications, past family history, past medical history, past social history, past surgical history and problem list.  Review of Systems Pertinent items noted in HPI and remainder of comprehensive ROS otherwise negative.   Objective:    BP 127/84   Pulse (!) 108   Ht 5' 2.5" (1.588 m)   Wt 232 lb (105.2 kg)   BMI 41.76 kg/m  General appearance: alert, cooperative, appears stated age and morbidly obese Head: Normocephalic, without obvious abnormality, atraumatic Eyes: conjunctivae/corneas clear. PERRL, EOM's intact. Fundi benign. Ears: normal TM's and external ear canals both ears Nose: Nares normal. Septum midline. Mucosa normal. No drainage or sinus tenderness. Throat: lips, mucosa, and tongue normal; teeth and gums normal Neck: no adenopathy, no carotid bruit, no JVD, supple, symmetrical,  trachea midline and thyroid not enlarged, symmetric, no tenderness/mass/nodules Back: symmetric, no curvature. ROM normal. No CVA tenderness. Lungs: clear to auscultation bilaterally Heart: regular rate and rhythm, S1, S2 normal, no murmur, click, rub or gallop Abdomen: soft, non-tender; bowel sounds normal; no masses,  no organomegaly Extremities: extremities normal, atraumatic, no cyanosis or edema Pulses: 2+ and symmetric Skin: Skin color, texture, turgor normal. No rashes or lesions Lymph nodes: Cervical, supraclavicular, and axillary nodes normal. Neurologic: Alert and oriented X 3, normal strength and tone. Normal symmetric reflexes. Normal coordination and gait    Assessment:    Healthy female exam.      Plan:    [name] KitchenMarland KitchenJalesha was seen today for annual exam.  Diagnoses and all orders for this visit:  Routine physical examination  Need for Tdap vaccination -     Tdap vaccine greater than or equal to 7yo IM  Colon cancer screening -     Ambulatory referral to Gastroenterology  Elevated liver enzymes -     Hepatic function panel  Onychomycosis -     ciclopirox (PENLAC) 8 % solution; Apply topically at bedtime. Apply over nail and surrounding skin. Apply daily over previous coat. After seven (7) days, may remove with alcohol and continue cycle.  Controlled type 2 diabetes mellitus without complication, without long-term current use of insulin (HCC) -     metFORMIN (GLUCOPHAGE) 500 MG tablet; Take 1 tablet (500 mg total) by mouth 2 (two) times daily with a meal. -  lisinopril (PRINIVIL,ZESTRIL) 2.5 MG tablet; Take 1 tablet (2.5 mg total) by mouth daily.   Pt has not been released to work out and exercise yet. Discussed weight loss and medications. She may be interested.  Reviewed labs.  Elevated liver enzymes hold tylenol/alcohol recheck in 4 weeks.  If elevated will get liver u/s.  Treat toenail fungs. Pt request podiatry referral for nail cutting.   See After Visit  Summary for Counseling Recommendations

## 2016-11-26 NOTE — Patient Instructions (Signed)

## 2016-11-28 DIAGNOSIS — R748 Abnormal levels of other serum enzymes: Secondary | ICD-10-CM | POA: Insufficient documentation

## 2016-11-28 DIAGNOSIS — B351 Tinea unguium: Secondary | ICD-10-CM | POA: Insufficient documentation

## 2016-12-27 ENCOUNTER — Ambulatory Visit (INDEPENDENT_AMBULATORY_CARE_PROVIDER_SITE_OTHER): Payer: BLUE CROSS/BLUE SHIELD | Admitting: Podiatry

## 2016-12-27 ENCOUNTER — Encounter: Payer: Self-pay | Admitting: Podiatry

## 2016-12-27 VITALS — BP 161/97 | HR 107 | Ht 62.5 in | Wt 232.0 lb

## 2016-12-27 DIAGNOSIS — M216X2 Other acquired deformities of left foot: Secondary | ICD-10-CM

## 2016-12-27 DIAGNOSIS — M216X1 Other acquired deformities of right foot: Secondary | ICD-10-CM | POA: Diagnosis not present

## 2016-12-27 DIAGNOSIS — M21969 Unspecified acquired deformity of unspecified lower leg: Secondary | ICD-10-CM

## 2016-12-27 DIAGNOSIS — E119 Type 2 diabetes mellitus without complications: Secondary | ICD-10-CM | POA: Diagnosis not present

## 2016-12-27 DIAGNOSIS — B351 Tinea unguium: Secondary | ICD-10-CM | POA: Diagnosis not present

## 2016-12-27 DIAGNOSIS — M205X9 Other deformities of toe(s) (acquired), unspecified foot: Secondary | ICD-10-CM

## 2016-12-27 NOTE — Progress Notes (Signed)
SUBJECTIVE: 61 y.o. year old female presents for diabetic foot care. She has developed a fungal infected nail and is currently being treated with Penlac. Also having some right knee and hip pain with Sciatica. Last HgA1c was 6.5. Patient was referred by PCP, Dr. Philis Fendt.   REVIEW OF SYSTEMS: A comprehensive review of systems was negative except for: Been through chemotherapy for breast cancer and the last treatment was done in 10/07/2008.  Patient is also being treated for right side Sciatic and back pain by a chiropractor.   OBJECTIVE: DERMATOLOGIC EXAMINATION: Thick yellow nail plate left great toe. Hypertrophic nails x 10.  VASCULAR EXAMINATION OF LOWER LIMBS: All pedal pulses are palpable with normal pulsation.  Capillary Filling times within 3 seconds in all digits.  No edema or erythema noted. Temperature gradient from tibial crest to dorsum of foot is within normal bilateral.  NEUROLOGIC EXAMINATION OF THE LOWER LIMBS: Achilles DTR is present and within normal. Monofilament (Semmes-Weinstein 10-gm) sensory testing positive 6 out of 6, bilateral. Vibratory sensations(128Hz  turning fork) intact at medial and lateral forefoot bilateral.  Sharp and Dull discriminatory sensations at the plantar ball of hallux is intact bilateral.   MUSCULOSKELETAL EXAMINATION: Positive for high arched cavus foot with hypermobile first ray bilateral. Tight Achilles tendon R>L. Excess Ankle joint pronation bilateral. Limited dorsiflexion of the first MPJ upon loading of forefoot bilateral.  ASSESSMENT: Onychomycosis left great toe. Ankle equinus R>L. Hypermobile first ray bilateral. Functional Hallux limitus bilateral. Compensated rearfoot varus with ankle pronation bilateral.  PLAN: Reviewed clinical findings and available treatment options. Reviewed stretch exercise to practice daily for tight Achilles tendon. All nails debrided. Continue with Penlac. May benefit from custom  orthotics for hypermobile first ray and Hallux limitus.

## 2016-12-27 NOTE — Patient Instructions (Addendum)
Seen for Diabetic foot check. Currently having sciatica, right knee, and back pain.  Noted of tight Achilles tendon R>L. Weakened first metatarsal bone bilateral.  Mild fungal infection on left great toe possibly due to abnormal biomechanics of the first metatarsal bone.  Reviewed stretch exercise for tight Achilles tendon to practice daily. All nails debrided. Will do every other month visit for toe nails. Continue using Penlac daily. May benefit from custom orthotics.

## 2016-12-28 LAB — HEPATIC FUNCTION PANEL
ALK PHOS: 57 U/L (ref 33–130)
ALT: 46 U/L — AB (ref 6–29)
AST: 29 U/L (ref 10–35)
Albumin: 4.3 g/dL (ref 3.6–5.1)
BILIRUBIN DIRECT: 0.1 mg/dL (ref <=0.2)
BILIRUBIN INDIRECT: 0.3 mg/dL (ref 0.2–1.2)
TOTAL PROTEIN: 7.1 g/dL (ref 6.1–8.1)
Total Bilirubin: 0.4 mg/dL (ref 0.2–1.2)

## 2016-12-28 NOTE — Progress Notes (Signed)
Liver enzymes remain just a tad elevated.  I would like to get u/s of liver. Is this ok?

## 2016-12-29 ENCOUNTER — Encounter: Payer: Self-pay | Admitting: Podiatry

## 2016-12-29 ENCOUNTER — Encounter: Payer: Self-pay | Admitting: Physician Assistant

## 2017-01-04 ENCOUNTER — Ambulatory Visit: Payer: Self-pay | Admitting: Podiatry

## 2017-01-14 ENCOUNTER — Telehealth: Payer: Self-pay | Admitting: *Deleted

## 2017-01-14 ENCOUNTER — Encounter (HOSPITAL_COMMUNITY): Payer: Self-pay

## 2017-01-14 ENCOUNTER — Encounter: Payer: Self-pay | Admitting: Physician Assistant

## 2017-01-14 ENCOUNTER — Emergency Department (HOSPITAL_COMMUNITY)
Admission: EM | Admit: 2017-01-14 | Discharge: 2017-01-15 | Disposition: A | Discharge status: Home / Self Care | Payer: BLUE CROSS/BLUE SHIELD | Attending: Emergency Medicine | Admitting: Emergency Medicine

## 2017-01-14 DIAGNOSIS — Z87891 Personal history of nicotine dependence: Secondary | ICD-10-CM | POA: Insufficient documentation

## 2017-01-14 DIAGNOSIS — R000 Tachycardia, unspecified: Secondary | ICD-10-CM | POA: Insufficient documentation

## 2017-01-14 DIAGNOSIS — R55 Syncope and collapse: Secondary | ICD-10-CM

## 2017-01-14 DIAGNOSIS — Z7984 Long term (current) use of oral hypoglycemic drugs: Secondary | ICD-10-CM | POA: Insufficient documentation

## 2017-01-14 DIAGNOSIS — R748 Abnormal levels of other serum enzymes: Secondary | ICD-10-CM

## 2017-01-14 LAB — BASIC METABOLIC PANEL
ANION GAP: 12 (ref 5–15)
BUN: 11 mg/dL (ref 6–20)
CHLORIDE: 104 mmol/L (ref 101–111)
CO2: 21 mmol/L — ABNORMAL LOW (ref 22–32)
Calcium: 9.8 mg/dL (ref 8.9–10.3)
Creatinine, Ser: 0.78 mg/dL (ref 0.44–1.00)
GFR calc Af Amer: >60 mL/min (ref >60)
GFR calc non Af Amer: >60 mL/min (ref >60)
Glucose, Bld: 162 mg/dL — ABNORMAL HIGH (ref 65–99)
POTASSIUM: 3.9 mmol/L (ref 3.5–5.1)
SODIUM: 137 mmol/L (ref 135–145)

## 2017-01-14 LAB — CBC
HEMATOCRIT: 42.7 % (ref 36.0–46.0)
HEMOGLOBIN: 14.4 g/dL (ref 12.0–15.0)
MCH: 29.4 pg (ref 26.0–34.0)
MCHC: 33.7 g/dL (ref 30.0–36.0)
MCV: 87.1 fL (ref 78.0–100.0)
Platelets: 288 10*3/uL (ref 150–400)
RBC: 4.9 MIL/uL (ref 3.87–5.11)
RDW: 13.6 % (ref 11.5–15.5)
WBC: 10.5 10*3/uL (ref 4.0–10.5)

## 2017-01-14 NOTE — Telephone Encounter (Signed)
Ultrasound ordered.  Pt notified.

## 2017-01-14 NOTE — ED Notes (Signed)
Pt alert and oriented x 4 and color is now normal.

## 2017-01-14 NOTE — ED Provider Notes (Signed)
[facility] DEPT Provider Note   CSN: [id number] Arrival date & time: 01/14/17  2131   History   Chief Complaint Chief Complaint  Patient presents with  . Loss of Consciousness    HPI Tina Mcgrath is a 61 y.o. female.  Patient was here visiting her son, who was a patient here in the emergency department. He was in the trauma bay. The patient reports that she started feeling hot and lightheaded. Attempted to sit down, and had a syncopal episode. Denies any other prodromal symptoms such as chest pain, shortness of breath, palpitations, headache, vision changes. Was seen by multiple staff members to have had a syncopal episode. Grossly diaphoretic. No body shaking.   The history is provided by the patient.  Illness  This is a new problem. The current episode started less than 1 hour ago. The problem occurs constantly. The problem has not changed since onset.Pertinent negatives include no chest pain, no abdominal pain, no headaches and no shortness of breath. She has tried nothing for the symptoms.    Past Medical History:  Diagnosis Date  . Cancer ([place])   . Dry eyes   . HSV-2 (herpes simplex virus 2) infection   . Migraines   . Osteoarthritis   . Rosacea   . Sleep apnea     Patient Active Problem List   Diagnosis Date Noted  . Elevated liver enzymes 11/28/2016  . Onychomycosis 11/28/2016  . Glaucoma suspect of both eyes 08/25/2016  . Carpal tunnel syndrome on both sides 08/23/2016  . Ulnar neuropathy of both upper extremities 08/23/2016  . Obesity 03/02/2016  . Cough 03/02/2016  . Prediabetes 11/21/2015  . Malignant neoplasm of right female breast ([place]) 11/04/2015  . Post-viral cough syndrome 11/04/2015  . Vitamin D deficiency 08/27/2015  . Chronic cystitis 01/06/2015  . Breast cancer ([place]) 11/13/2014  . Elevated blood pressure 11/13/2014  . Proteinuria 09/10/2014  . Elevated triglycerides with high cholesterol 09/03/2014  . Controlled diabetes mellitus type II  without complication ([place]) 99/77/4142  . Hyperlipidemia 08/19/2014  . Elevated vitamin B12 level 08/19/2014  . Hematuria 08/19/2014  . Primary osteoarthritis of left knee 08/19/2014  . Dry eyes 08/19/2014  . Migraine without aura and with status migrainosus, not intractable 08/19/2014  . Mild sleep apnea 08/19/2014  . Fecal incontinence 08/19/2014    Past Surgical History:  Procedure Laterality Date  . BREAST SURGERY Right 09/07/2007  . LASIK      OB History    No data available       Home Medications    Prior to Admission medications   Medication Sig Start Date End Date Taking? Authorizing Provider  Acetaminophen (TYLENOL EXTRA STRENGTH PO) Take by mouth as needed.    [provider]  CALCIUM-VITAMIN D PO Take by mouth. 540m of calcium-100iu of vitamin d3    [provider]  Cholecalciferol (VITAMIN D3) 5000 units TABS Take by mouth.    [provider]  ciclopirox (PENLAC) 8 % solution Apply topically at bedtime. Apply over nail and surrounding skin. Apply daily over previous coat. After seven (7) days, may remove with alcohol and continue cycle. 11/26/16   Breeback, JRoyetta Car PA-C  Coenzyme Q10 (COQ10) 100 MG CAPS Take by mouth daily.    [provider]  Lactobacillus-Inulin (CC[place]rell HillPO) Take by mouth 2 (two) times daily.    [provider]  lisinopril (PRINIVIL,ZESTRIL) 2.5 MG tablet Take 1 tablet (2.5 mg total) by mouth daily. 11/26/16   Breeback,  Jade L, PA-C  loratadine (CLARITIN) 10 MG tablet Take 10 mg by mouth daily.    [provider]  metFORMIN (GLUCOPHAGE) 500 MG tablet Take 1 tablet (500 mg total) by mouth 2 (two) times daily with a meal. 11/26/16   Breeback, Jade L, PA-C  nitrofurantoin (MACRODANTIN) 100 MG capsule Take 100 mg by mouth daily.    [provider]  Omega-3 Krill Oil 500 MG CAPS Take 1,000 mg by mouth 2 (two) times daily.     [provider]  Pitavastatin Calcium  (LIVALO) 2 MG TABS Take 1 tab every other day 11/10/15   Iran Planas L, PA-C  SUMAtriptan (IMITREX) 100 MG tablet Take 1 tablet (100 mg total) by mouth as needed for migraine or headache. May repeat in 2 hours if headache persists or recurs. 11/12/14   Breeback, Royetta Car, PA-C  valACYclovir (VALTREX) 500 MG tablet Take 1 tablet (500 mg total) by mouth as needed. 11/12/14   Donella Stade, PA-C    Family History Family History  Problem Relation Age of Onset  . Hyperlipidemia Mother   . Hypertension Mother   . Heart attack Father   . Hypertension Father   . Hyperlipidemia Father   . Diabetes Father   . Cancer Father        prostate and colon  . Alcohol abuse Brother   . Cancer Maternal Uncle        esophageal  . Cancer Paternal Aunt        breast  . Macular degeneration Maternal Grandmother   . Arthritis Maternal Grandmother   . Cancer Maternal Grandfather        multiple myeloma, bone marrow  . Stroke Paternal Grandmother   . Hypertension Paternal Grandmother   . Diabetes Paternal Grandmother   . Osteoporosis Paternal Grandmother   . Heart attack Paternal Grandmother   . Hypertension Paternal Grandfather     Social History Social History  Substance Use Topics  . Smoking status: Former Research scientist (life sciences)  . Smokeless tobacco: Never Used  . Alcohol use No     Allergies   Augmentin [amoxicillin-pot clavulanate]; Crestor [rosuvastatin]; Doxycycline hyclate; Femara [letrozole]; Lipitor [atorvastatin]; Sulfa antibiotics; Zocor [simvastatin]; Adhesive [tape]; and Ciprofloxacin   Review of Systems Review of Systems  Constitutional: Negative for chills and fever.  Eyes: Negative for visual disturbance.  Respiratory: Negative for cough and shortness of breath.   Cardiovascular: Negative for chest pain, palpitations and leg swelling.  Gastrointestinal: Negative for abdominal pain, diarrhea, nausea and vomiting.  Musculoskeletal: Negative for back pain.  Neurological: Positive for syncope  and light-headedness. Negative for seizures, speech difficulty, weakness, numbness and headaches.     Physical Exam Updated Vital Signs BP 103/70   Pulse (!) 103   Temp 97.9 F (36.6 C) (Oral)   Resp 16   Ht 5' 2.5" (1.588 m)   Wt 105.2 kg   SpO2 99%   BMI 41.76 kg/m   Physical Exam  Constitutional: She appears well-developed and well-nourished. No distress.  Appears pale. Grossly diaphoretic.  HENT:  Head: Normocephalic and atraumatic.  Mouth/Throat: Mucous membranes are dry.  Eyes: Conjunctivae are normal.  Neck: Neck supple.  Cardiovascular: Regular rhythm.  Tachycardia present.   No murmur heard. Pulmonary/Chest: Effort normal and breath sounds normal. No respiratory distress. She has no wheezes. She has no rhonchi.  Abdominal: Soft. There is no tenderness. There is no rebound and no guarding.  Musculoskeletal: She exhibits no edema.  Right lower leg: She exhibits no swelling and no edema.       Left lower leg: She exhibits no swelling and no edema.  Neurological: She is alert. She has normal strength. No cranial nerve deficit or sensory deficit. She displays a negative Romberg sign. Coordination normal. GCS eye subscore is 4. GCS verbal subscore is 5. GCS motor subscore is 6.  Skin: Skin is warm and dry.  Psychiatric: She has a normal mood and affect.  Nursing note and vitals reviewed.    ED Treatments / Results  Labs (all labs ordered are listed, but only abnormal results are displayed) Labs Reviewed  BASIC METABOLIC PANEL - Abnormal; Notable for the following:       Result Value   CO2 21 (*)    Glucose, Bld 162 (*)    All other components within normal limits  CBC  URINALYSIS, ROUTINE W REFLEX MICROSCOPIC  CBG MONITORING, ED    EKG  EKG Interpretation  Date/Time:  Friday Jan 14 2017 21:33:13 EDT Ventricular Rate:  104 PR Interval:    QRS Duration: 90 QT Interval:  369 QTC Calculation: 486 R Axis:   59 Text Interpretation:  Sinus tachycardia  Borderline prolonged QT interval Baseline wander in lead(s) I II III aVL aVF V2 V3 V4 No previous tracing Confirmed by Maryan Rued  MD, Loree Fee ([id number]) on 01/14/2017 9:40:04 PM       Radiology No results found.  Procedures Procedures (including critical care time)  Medications Ordered in ED Medications - No data to display   Initial Impression / Assessment and Plan / ED Course  I have reviewed the triage vital signs and the nursing notes.  Pertinent labs & imaging results that were available during my care of the patient were reviewed by me and considered in my medical decision making (see chart for details).     Feel most likely causative of the patient's syncopal episode is vasovagal in nature. She reports feeling hot in the room prior to the episode. She also reports not eating in the last 8 hours. No prodromal symptoms to indicate acute cardiac or neurological cause of her syncopal episode. EKG here without ischemic changes or interval abnormalities. No focal neurological deficits on exam. Labs consistent with mild dehydration. Otherwise within normal limits. No anemia. No electrolyte abnormalities. Gave the patient some IV fluids with improvement in symptoms. Of note, the patient reports she has a resting heart rate of about 100 that is normal for her. Vital signs seem to be consistent with her baseline.  Patient ambulatory throughout the emergency department without issue prior to discharge.  Final Clinical Impressions(s) / ED Diagnoses   Final diagnoses:  Syncope, unspecified syncope type    New Prescriptions Discharge Medication List as of 01/14/2017 10:21 PM       Maryan Puls, MD 01/15/17 1324    Blanchie Dessert, MD 01/16/17 2252

## 2017-01-14 NOTE — ED Triage Notes (Signed)
Pt in room with her son in the ED and pt sitting in chair and became unresponsive for 30 seconds. Pt assisted to a stretcher. Pt diaphoretic, cold and clammy. No hx of DM. Pt now alert and oriented x 4 and can recall the event.

## 2017-01-14 NOTE — ED Notes (Signed)
Pt awake and alert, skin PWD

## 2017-01-19 ENCOUNTER — Encounter: Payer: Self-pay | Admitting: Physician Assistant

## 2017-01-19 ENCOUNTER — Ambulatory Visit (INDEPENDENT_AMBULATORY_CARE_PROVIDER_SITE_OTHER): Payer: BLUE CROSS/BLUE SHIELD

## 2017-01-19 DIAGNOSIS — K760 Fatty (change of) liver, not elsewhere classified: Secondary | ICD-10-CM | POA: Diagnosis not present

## 2017-01-19 NOTE — Telephone Encounter (Signed)
Call pt: fatty liver and likely what is causing elevated liver enzymes. Everything else looks great. Weight loss can help.

## 2017-01-20 ENCOUNTER — Encounter: Payer: Self-pay | Admitting: Physician Assistant

## 2017-01-26 ENCOUNTER — Ambulatory Visit (INDEPENDENT_AMBULATORY_CARE_PROVIDER_SITE_OTHER): Payer: BLUE CROSS/BLUE SHIELD | Admitting: Podiatry

## 2017-01-26 DIAGNOSIS — M216X1 Other acquired deformities of right foot: Secondary | ICD-10-CM

## 2017-01-26 DIAGNOSIS — M21969 Unspecified acquired deformity of unspecified lower leg: Secondary | ICD-10-CM

## 2017-01-26 DIAGNOSIS — M216X2 Other acquired deformities of left foot: Secondary | ICD-10-CM

## 2017-01-26 DIAGNOSIS — M205X9 Other deformities of toe(s) (acquired), unspecified foot: Secondary | ICD-10-CM | POA: Diagnosis not present

## 2017-01-26 NOTE — Patient Instructions (Signed)
Both feet casted for orthotics.  Was seen for weakened first ray. Will call when they are ready.

## 2017-01-26 NOTE — Progress Notes (Signed)
SUBJECTIVE: 61 y.o. year old female presents requesting custom orthotics.   HPI: Initially seen on 12/27/16 for diabetic foot care . She has developed a fungal infected nail and is currently being treated with Penlac. Also having some right knee and hip pain with Sciatica. Last HgA1c was 6.5. Patient was referred by PCP, Dr. Philis Fendt.   REVIEW OF SYSTEMS: A comprehensive review of systems was negative except for: Been through chemotherapy for breast cancer and the last treatment was done in 10/07/2008.  Patient is also being treated for right side Sciatic and back pain by a chiropractor.   OBJECTIVE: DERMATOLOGIC EXAMINATION: Thick yellow nail plate left great toe. Hypertrophic nails x 10.  VASCULAR EXAMINATION OF LOWER LIMBS: All pedal pulses are palpable with normal pulsation.  Capillary Filling times within 3 seconds in all digits.  No edema or erythema noted. Temperature gradient from tibial crest to dorsum of foot is within normal bilateral.  NEUROLOGIC EXAMINATION OF THE LOWER LIMBS: Achilles DTR is present and within normal. Monofilament (Semmes-Weinstein 10-gm) sensory testing positive 6 out of 6, bilateral. Vibratory sensations(128Hz  turning fork) intact at medial and lateral forefoot bilateral.  Sharp and Dull discriminatory sensations at the plantar ball of hallux is intact bilateral.   MUSCULOSKELETAL EXAMINATION: Positive for high arched cavus foot with hypermobile first ray bilateral. Tight Achilles tendon R>L. Excess Ankle joint pronation bilateral. Limited dorsiflexion of the first MPJ upon loading of forefoot bilateral.  Radiographic examination reveal high arched rearfoot with elevated first metatarsal bone. No change in lateral deviation angle of CCJ in Ap view.  ASSESSMENT: Onychomycosis left great toe. Ankle equinus R>L. Hypermobile first ray bilateral. Functional Hallux limitus bilateral. Compensated rearfoot varus with ankle pronation  bilateral.  PLAN: Reviewed clinical findings and available treatment options. Continue with stretch exercise to practice daily for tight Achilles tendon. Continue with Penlac. Both feet casted for custom orthotics for hypermobile first ray and Hallux limitus.

## 2017-01-27 ENCOUNTER — Encounter: Payer: Self-pay | Admitting: Podiatry

## 2017-03-02 ENCOUNTER — Ambulatory Visit: Payer: BLUE CROSS/BLUE SHIELD | Admitting: Physician Assistant

## 2017-03-24 ENCOUNTER — Ambulatory Visit (INDEPENDENT_AMBULATORY_CARE_PROVIDER_SITE_OTHER): Payer: BLUE CROSS/BLUE SHIELD | Admitting: Podiatry

## 2017-03-24 ENCOUNTER — Encounter: Payer: Self-pay | Admitting: Podiatry

## 2017-03-24 DIAGNOSIS — M216X2 Other acquired deformities of left foot: Secondary | ICD-10-CM | POA: Diagnosis not present

## 2017-03-24 DIAGNOSIS — M79671 Pain in right foot: Secondary | ICD-10-CM

## 2017-03-24 DIAGNOSIS — M205X9 Other deformities of toe(s) (acquired), unspecified foot: Secondary | ICD-10-CM

## 2017-03-24 DIAGNOSIS — M79672 Pain in left foot: Secondary | ICD-10-CM

## 2017-03-24 DIAGNOSIS — B351 Tinea unguium: Secondary | ICD-10-CM

## 2017-03-24 DIAGNOSIS — M216X1 Other acquired deformities of right foot: Secondary | ICD-10-CM

## 2017-03-24 NOTE — Progress Notes (Signed)
SUBJECTIVE: 61 y.o.year old femalepresents stating that she has been wearing orthotics for over a month. They are doing well and has no complaints. Patient request trimming toe nails. She continues to use penlac.  HPI: Initially seen on 12/27/16 for diabetic foot care . She has developed a fungal infected nail and is currently being treated with Penlac. Also having some right knee and hip pain with Sciatica. Last HgA1c was 6.5. Patient was referred by PCP, Dr. Philis Fendt.   REVIEW OF SYSTEMS: A comprehensive review of systems was negative except for: Been through chemotherapy for breast cancer and the last treatment was done in 10/07/2008.  Patient is also being treated for right side Sciatic and back pain by a chiropractor.   OBJECTIVE: DERMATOLOGIC EXAMINATION: Thick yellow nail plate left great toe. Hypertrophic nails x 10.  VASCULAR EXAMINATION OF LOWER LIMBS: All pedal pulses are palpable with normal pulsation.  Capillary Filling times within 3 seconds in all digits.  No edema or erythema noted. Temperature gradient from tibial crest to dorsum of foot is within normal bilateral.  NEUROLOGIC EXAMINATION OF THE LOWER LIMBS: All epicritic and tactile sensations grossly intact.  MUSCULOSKELETAL EXAMINATION: Positive for high arched cavus foot with hypermobile first ray bilateral. Tight Achilles tendon R>L. Excess Ankle joint pronation bilateral. Limited dorsiflexion of the first MPJ upon loading of forefoot bilateral.  ASSESSMENT: Onychomycosis left great toe. Ankle equinus R>L. Hypermobile first ray bilateral. Functional Hallux limitus bilateral. Compensated rearfoot varus with ankle pronation bilateral.  PLAN: Reviewed clinical findings and available treatment options. Continue with stretch exercise to practice daily for tight Achilles tendon. Continue with Penlac. All nails debrided.

## 2017-03-24 NOTE — Patient Instructions (Signed)
Seen for follow up on orthotic treatment and hypertrophic nails. Doing well with orthotics. All nails debrided. Return in 3 months or as needed.

## 2017-05-31 LAB — HM DIABETES EYE EXAM

## 2017-06-23 ENCOUNTER — Ambulatory Visit (INDEPENDENT_AMBULATORY_CARE_PROVIDER_SITE_OTHER): Payer: Self-pay | Admitting: Podiatry

## 2017-06-23 ENCOUNTER — Encounter: Payer: Self-pay | Admitting: Podiatry

## 2017-06-23 DIAGNOSIS — B351 Tinea unguium: Secondary | ICD-10-CM

## 2017-06-23 NOTE — Progress Notes (Signed)
Subjective: 61 y.o. year old female patient presents requesting toe nails trimmed. Doing well with stretch exercise daily. Orthotics are helping to walk batter with less of back pain.   HPI: Seen for diabetic foot care, frequent ankle sprain, and fungal nails. Blood sugar is under control with A1c near at 6, treated with palliative foot care for diabetic foot, orthotics, stretch exercise, and Penlac for fungal nail.   Objective: Dermatologic: Thick yellow nail distal 1/4 left great toe. Thin improved nail growth on left great toe.   Vascular: Pedal pulses are all palpable. Orthopedic: Previous examination revealed tight Achilles tendon R>L, high arched cavus foot with hypermobile first ray bilateral, excess ankle joint pronation, limited dorsiflexion of the first MPJ with weight bearing bilateral. Neurologic: All epicritic and tactile sensations grossly intact.  Assessment: Improving mycotic nail left great toe. Contracture right Achilles tendon  Treatment: All mycotic nails debrided, routine foot care. Continue with stretch exercise for tight Achilles tendon. Continue with orthotics for heel pain.

## 2017-06-23 NOTE — Patient Instructions (Signed)
Seen for hypertrophic nails. Noted of improving nail on left great toe. Continue with stretch exercise.  Having discomfort from distal end of orthotics that meets at sulcus area. If continue to be a problem, bring them back to replace top layer to full lenth.  All nails debrided. Return in 3 months or as needed.

## 2017-09-22 ENCOUNTER — Ambulatory Visit: Payer: BLUE CROSS/BLUE SHIELD | Admitting: Podiatry

## 2017-09-22 ENCOUNTER — Encounter: Payer: Self-pay | Admitting: Podiatry

## 2017-09-22 DIAGNOSIS — M216X2 Other acquired deformities of left foot: Secondary | ICD-10-CM

## 2017-09-22 DIAGNOSIS — M205X9 Other deformities of toe(s) (acquired), unspecified foot: Secondary | ICD-10-CM

## 2017-09-22 DIAGNOSIS — B351 Tinea unguium: Secondary | ICD-10-CM

## 2017-09-22 DIAGNOSIS — M216X1 Other acquired deformities of right foot: Secondary | ICD-10-CM | POA: Diagnosis not present

## 2017-09-22 MED ORDER — CICLOPIROX 8 % EX SOLN: Freq: Every day | CUTANEOUS | 3 refills | Status: DC

## 2017-09-22 NOTE — Patient Instructions (Signed)
Seen for hypertrophic nails. All nails debrided. Return in 3 months or as needed.  

## 2017-09-22 NOTE — Progress Notes (Signed)
Subjective: 62 y.o. year old female patient presents complaining of painful nails. Patient requests toe nails trimmed.  Patient continues to use Penlac with some improvement. Plantar fasciitis treated with orthotics with satisfaction.  HPI: Seen for diabetic foot care, frequent ankle sprain, and fungal nails. Blood sugar is under control with A1c near at 6, treated with palliative foot care for diabetic foot, orthotics, stretch exercise, and Penlac for fungal nail.   Objective: Dermatologic: Thick yellow deformed nails x 10. Vascular: Pedal pulses are all palpable. Orthopedic: Tight Achilles tendon R>L, high arched foot with hypermobile first ray bilateral, excess pronation at ankle joint, limited dorsiflexion 1st MPJ with forefoot loading bilateral. Neurologic: All epicritic and tactile sensations grossly intact.  Assessment: Dystrophic mycotic nails x 10 showing some improvement. Contracture right Achilles tendon. Hallux limitus with hypermobile first ray. History of plantar fasciitis.  Treatment: All mycotic nails debrided.  Continue with stretch exercise and use Orthotics for plantar fasciitis. Return in 3 months or as needed.

## 2017-09-23 ENCOUNTER — Encounter: Payer: Self-pay | Admitting: Physician Assistant

## 2017-09-23 ENCOUNTER — Ambulatory Visit: Payer: BLUE CROSS/BLUE SHIELD | Admitting: Physician Assistant

## 2017-09-23 ENCOUNTER — Ambulatory Visit (INDEPENDENT_AMBULATORY_CARE_PROVIDER_SITE_OTHER): Payer: BLUE CROSS/BLUE SHIELD

## 2017-09-23 VITALS — BP 139/77 | HR 99 | Ht 62.5 in | Wt 223.0 lb

## 2017-09-23 DIAGNOSIS — M25562 Pain in left knee: Secondary | ICD-10-CM

## 2017-09-23 DIAGNOSIS — E119 Type 2 diabetes mellitus without complications: Secondary | ICD-10-CM | POA: Diagnosis not present

## 2017-09-23 DIAGNOSIS — R7303 Prediabetes: Secondary | ICD-10-CM

## 2017-09-23 DIAGNOSIS — E785 Hyperlipidemia, unspecified: Secondary | ICD-10-CM

## 2017-09-23 LAB — POCT GLYCOSYLATED HEMOGLOBIN (HGB A1C): HEMOGLOBIN A1C: 10.5

## 2017-09-23 MED ORDER — SITAGLIPTIN PHOSPHATE 100 MG PO TABS: 100.0000 mg | ORAL_TABLET | Freq: Every day | ORAL | 2 refills | Status: DC

## 2017-09-23 MED ORDER — DULAGLUTIDE 0.75 MG/0.5ML ~~LOC~~ SOAJ: 1.0000 "application " | SUBCUTANEOUS | 2 refills | Status: DC

## 2017-09-23 NOTE — Progress Notes (Signed)
Subjective:    Patient ID: Tina Mcgrath, female    DOB: March 27, 1956, 62 y.o.   MRN: [id number]  HPI  Pt is a 62 yo female who presents to the clinic for 6 month follow up on DM.   DM- she is not checking sugars. She is taking metformin daily. No hypoglyemic issues. No open sores or wounds. She has been controlled with a1c for a years on metformin. Last a1c was march and was 6.4. She has been to eye doctor recently. She has been trying to watch her diet eating more veggies and fish.    Pt is a also having left knee pain since middle of December after a twisting injury. She used crutches for a week or so and then has tried to take it easy. She has had little improvement. She has been using tylenol has needed. She feels like she can't bear weight on it totally without it "giving way".   .. Active Ambulatory Problems    Diagnosis Date Noted  . Hyperlipidemia 08/19/2014  . Elevated vitamin B12 level 08/19/2014  . Hematuria 08/19/2014  . Primary osteoarthritis of left knee 08/19/2014  . Dry eyes 08/19/2014  . Migraine without aura and with status migrainosus, not intractable 08/19/2014  . Mild sleep apnea 08/19/2014  . Fecal incontinence 08/19/2014  . Elevated triglycerides with high cholesterol 09/03/2014  . Controlled diabetes mellitus type II without complication ([place]) 56/31/4970  . Proteinuria 09/10/2014  . Breast cancer ([place]) 11/13/2014  . Elevated blood pressure 11/13/2014  . Chronic cystitis 01/06/2015  . Vitamin D deficiency 08/27/2015  . Malignant neoplasm of right female breast ([place]) 11/04/2015  . Post-viral cough syndrome 11/04/2015  . Prediabetes 11/21/2015  . Obesity 03/02/2016  . Cough 03/02/2016  . Carpal tunnel syndrome on both sides 08/23/2016  . Ulnar neuropathy of both upper extremities 08/23/2016  . Glaucoma suspect of both eyes 08/25/2016  . Elevated liver enzymes 11/28/2016  . Onychomycosis 11/28/2016  . Fatty liver disease, nonalcoholic 26/37/8588   Resolved  Ambulatory Problems    Diagnosis Date Noted  . Impaired fasting glucose 08/19/2014   Past Medical History:  Diagnosis Date  . Cancer ([place])   . Dry eyes   . HSV-2 (herpes simplex virus 2) infection   . Migraines   . Osteoarthritis   . Rosacea   . Sleep apnea         Review of Systems  All other systems reviewed and are negative.      Objective:   Physical Exam  Constitutional: She is oriented to person, place, and time. She appears well-developed and well-nourished.  Obese.   HENT:  Head: Normocephalic and atraumatic.  Neck: Normal range of motion. Neck supple. No thyromegaly present.  Cardiovascular: Normal rate, regular rhythm and normal heart sounds.  Pulmonary/Chest: Effort normal and breath sounds normal.  Musculoskeletal:  NROM.  Strength 5/5 of lower extermity.  Tenderness over lateral left joint space to palpation.  Negative anterior drawer.  Negative mcmurrays.   Neurological: She is alert and oriented to person, place, and time.  Psychiatric: She has a normal mood and affect. Her behavior is normal.          Assessment & Plan:  [name] KitchenMarland KitchenSagrario was seen today for diabetes and left knee pain.  Diagnoses and all orders for this visit:  Controlled type 2 diabetes mellitus without complication, without long-term current use of insulin (HCC) -     POCT HgB A1C -     Dulaglutide (TRULICITY) 5.02  MG/0.5ML SOPN; Inject 1 application into the skin once a week. -     sitaGLIPtin (JANUVIA) 100 MG tablet; Take 1 tablet (100 mg total) by mouth daily.  Acute pain of left knee -     DG Knee 4 Views W/Patella Left  Hyperlipidemia, unspecified hyperlipidemia type  Prediabetes   .[name] Depression screen [facility] 2/9 09/23/2017 11/26/2016  Decreased Interest 0 0  Down, Depressed, Hopeless 1 0  PHQ - 2 Score 1 0  Altered sleeping 1 -  Tired, decreased energy 0 -  Change in appetite 0 -  Feeling bad or failure about yourself  0 -  Trouble concentrating 0 -  Moving slowly or  fidgety/restless 0 -  Suicidal thoughts 0 -  PHQ-9 Score 2 -  Difficult doing work/chores Not difficult at all -   .. Lab Results  Component Value Date   HGBA1C 10.5 09/23/2017   A!C much worse.  Added [place] and trulicity to meformin.  Discussed diabetic diet.  Not able to tolerate statins.  On ACE.  ON ASA.   Eye exam done by Tina Mcgrath. Will call to get records.   Follow up in 3 months.   Ordered xray to evaluate left knee pain.  Seems like there could be a LCL strain vs meniscal tear.  Exercises given to start to see if knee can be strengthen.  Consider wearing knee sleeve for support.  Use topical antiinflammatories.  May need MRI. Discussed follow up with sports medicine.   [name] KitchenSpent 30 minutes with patient and greater than 50 percent of visit spent counseling patient regarding treatment plan.

## 2017-09-23 NOTE — Patient Instructions (Addendum)
Meniscus Tear, Phase II Rehab After Surgery These exercises are more advanced than phase I exercises. Ask your health care provider which exercises are safe for you. Do exercises exactly as told by your health care provider and adjust them as directed. It is normal to feel mild stretching, pulling, tightness, or discomfort as you do these exercises, but you should stop right away if you feel sudden pain or your pain gets worse.Do not begin these exercises until told by your health care provider. Stretching and range of motion exercises These exercises warm up your muscles and joints and improve the movement and flexibility of your knee. These exercises also help to relieve pain and stiffness. Exercise A: Quadriceps stretch, prone  1. Lie on your abdomen on a firm surface, such as a bed or padded floor. 2. Bend your left / right knee and hold your ankle. If you cannot reach your ankle or pant leg, loop a belt around your foot and grab the belt instead. 3. Gently pull your heel toward your buttocks. Your knee should not slide out to the side. You should feel a stretch in the front of your thigh and knee. 4. Hold this position for __________ seconds. Repeat __________ times. Complete this exercise __________ times a day. Exercise B: Hamstring stretch, doorway  1. Lie on your back in front of a doorway with your left / right leg resting against the wall and your other leg flat on the floor in the doorway. There should be a slight bend in your left / right knee. 2. Straighten your left / right knee. You should feel a stretch behind your knee or thigh. If you do not, scoot your bottom closer to the doorway. 3. Hold this position for __________ seconds. Repeat __________ times. Complete this exercise __________ times a day. Strengthening exercises These exercises build strength and endurance in your knee. Endurance is the ability to use your muscles for a long time, even after they get tired. If told by  your health care provider, wear your brace while you do these exercises. Exercise C: Long arcs ( quadriceps) If told by your health care provider, do this exercise while wearing ankle weights. Begin with __________ ankle weights. Then increase the weight by 1 1b (0.5 kg) increments. Do not wear ankle weights that are more than __________. 1. Sit in a chair. Start with both feet on the floor. 2. Slowly straighten your left / right knee and bring your foot out in front of you. Keep your thighs on the chair. 3. Hold this position for __________ seconds. 4. Slowly return your knee to the bent position. Repeat __________ times. Complete this exercise __________ times a day. Exercise D: Wall slides ( quadriceps) 1. Lean your back against a smooth wall or door while you walk your feet out 18-24 inches (46-61 cm) from it. 2. Place your feet hip-width apart. 3. Slowly slide down the wall or door until your knees bend __________ degrees. Keep your knees over your heels, not over your toes. Keep your knees in line with your hips. 4. Hold this position for __________ seconds. 5. Push through your heels to stand up to rest for __________ seconds after each repetition. Repeat __________ times. Complete this exercise __________ times a day. Exercise E: Standing heel raise ( plantar flexors) 1. Stand with your feet shoulder-width apart. 2. Keep your weight spread evenly over the width of your feet while you rise up on your toes. Use a wall or table to  steady yourself, but try not to use it for support. 3. Hold this position for __________ seconds. 4. Slowly lower yourself down to the starting position. Repeat __________ times. Complete this exercise __________ times a day. Exercise F: Hamstring curls  If told by your health care provider, do this exercise while wearing ankle weights. Begin with __________ weights. Then increase the weight by 1 lb (0.5 kg) increments. Do not wear ankle weights that are more  than __________. 1. Lie on your abdomen with your legs extended. Put a folded towel under your left / right thigh. 2. Bend your left / right knee up to 90 degrees to make the shape of a letter "L." Keep your hips flat against the surface that is under them. 3. Hold this position for __________ seconds. 4. Slowly lower your leg to the starting position. Repeat __________ times. Complete this exercise __________ times a day. This information is not intended to replace advice given to you by your health care provider. Make sure you discuss any questions you have with your health care provider. Document Released: 12/14/2005 Document Revised: 04/29/2016 Document Reviewed: 09/06/2015 Elsevier Interactive Patient Education  2018 [employer].   Knee Exercises Ask your health care provider which exercises are safe for you. Do exercises exactly as told by your health care provider and adjust them as directed. It is normal to feel mild stretching, pulling, tightness, or discomfort as you do these exercises, but you should stop right away if you feel sudden pain or your pain gets worse.Do not begin these exercises until told by your health care provider. STRETCHING AND RANGE OF MOTION EXERCISES These exercises warm up your muscles and joints and improve the movement and flexibility of your knee. These exercises also help to relieve pain, numbness, and tingling. Exercise A: Knee Extension, Prone 1. Lie on your abdomen on a bed. 2. Place your left / right knee just beyond the edge of the surface so your knee is not on the bed. You can put a towel under your left / right thigh just above your knee for comfort. 3. Relax your leg muscles and allow gravity to straighten your knee. You should feel a stretch behind your left / right knee. 4. Hold this position for __________ seconds. 5. Scoot up so your knee is supported between repetitions. Repeat __________ times. Complete this stretch __________ times a  day. Exercise B: Knee Flexion, Active  1. Lie on your back with both knees straight. If this causes back discomfort, bend your left / right knee so your foot is flat on the floor. 2. Slowly slide your left / right heel back toward your buttocks until you feel a gentle stretch in the front of your knee or thigh. 3. Hold this position for __________ seconds. 4. Slowly slide your left / right heel back to the starting position. Repeat __________ times. Complete this exercise __________ times a day. Exercise C: Quadriceps, Prone  1. Lie on your abdomen on a firm surface, such as a bed or padded floor. 2. Bend your left / right knee and hold your ankle. If you cannot reach your ankle or pant leg, loop a belt around your foot and grab the belt instead. 3. Gently pull your heel toward your buttocks. Your knee should not slide out to the side. You should feel a stretch in the front of your thigh and knee. 4. Hold this position for __________ seconds. Repeat __________ times. Complete this stretch __________ times a day. Exercise  D: Hamstring, Supine 1. Lie on your back. 2. Loop a belt or towel over the ball of your left / right foot. The ball of your foot is on the walking surface, right under your toes. 3. Straighten your left / right knee and slowly pull on the belt to raise your leg until you feel a gentle stretch behind your knee. ? Do not let your left / right knee bend while you do this. ? Keep your other leg flat on the floor. 4. Hold this position for __________ seconds. Repeat __________ times. Complete this stretch __________ times a day. STRENGTHENING EXERCISES These exercises build strength and endurance in your knee. Endurance is the ability to use your muscles for a long time, even after they get tired. Exercise E: Quadriceps, Isometric  1. Lie on your back with your left / right leg extended and your other knee bent. Put a rolled towel or small pillow under your knee if told by your  health care provider. 2. Slowly tense the muscles in the front of your left / right thigh. You should see your kneecap slide up toward your hip or see increased dimpling just above the knee. This motion will push the back of the knee toward the floor. 3. For __________ seconds, keep the muscle as tight as you can without increasing your pain. 4. Relax the muscles slowly and completely. Repeat __________ times. Complete this exercise __________ times a day. Exercise F: Straight Leg Raises - Quadriceps 1. Lie on your back with your left / right leg extended and your other knee bent. 2. Tense the muscles in the front of your left / right thigh. You should see your kneecap slide up or see increased dimpling just above the knee. Your thigh may even shake a bit. 3. Keep these muscles tight as you raise your leg 4-6 inches (10-15 cm) off the floor. Do not let your knee bend. 4. Hold this position for __________ seconds. 5. Keep these muscles tense as you lower your leg. 6. Relax your muscles slowly and completely after each repetition. Repeat __________ times. Complete this exercise __________ times a day. Exercise G: Hamstring, Isometric 1. Lie on your back on a firm surface. 2. Bend your left / right knee approximately __________ degrees. 3. Dig your left / right heel into the surface as if you are trying to pull it toward your buttocks. Tighten the muscles in the back of your thighs to dig as hard as you can without increasing any pain. 4. Hold this position for __________ seconds. 5. Release the tension gradually and allow your muscles to relax completely for __________ seconds after each repetition. Repeat __________ times. Complete this exercise __________ times a day. Exercise H: Hamstring Curls  If told by your health care provider, do this exercise while wearing ankle weights. Begin with __________ weights. Then increase the weight by 1 lb (0.5 kg) increments. Do not wear ankle weights that  are more than __________. 1. Lie on your abdomen with your legs straight. 2. Bend your left / right knee as far as you can without feeling pain. Keep your hips flat against the floor. 3. Hold this position for __________ seconds. 4. Slowly lower your leg to the starting position.  Repeat __________ times. Complete this exercise __________ times a day. Exercise I: Squats (Quadriceps) 1. Stand in front of a table, with your feet and knees pointing straight ahead. You may rest your hands on the table for balance but not for  support. 2. Slowly bend your knees and lower your hips like you are going to sit in a chair. ? Keep your weight over your heels, not over your toes. ? Keep your lower legs upright so they are parallel with the table legs. ? Do not let your hips go lower than your knees. ? Do not bend lower than told by your health care provider. ? If your knee pain increases, do not bend as low. 3. Hold the squat position for __________ seconds. 4. Slowly push with your legs to return to standing. Do not use your hands to pull yourself to standing. Repeat __________ times. Complete this exercise __________ times a day. Exercise J: Wall Slides (Quadriceps)  1. Lean your back against a smooth wall or door while you walk your feet out 18-24 inches (46-61 cm) from it. 2. Place your feet hip-width apart. 3. Slowly slide down the wall or door until your knees bend __________ degrees. Keep your knees over your heels, not over your toes. Keep your knees in line with your hips. 4. Hold for __________ seconds. Repeat __________ times. Complete this exercise __________ times a day. Exercise K: Straight Leg Raises - Hip Abductors 1. Lie on your side with your left / right leg in the top position. Lie so your head, shoulder, knee, and hip line up. You may bend your bottom knee to help you keep your balance. 2. Roll your hips slightly forward so your hips are stacked directly over each other and your  left / right knee is facing forward. 3. Leading with your heel, lift your top leg 4-6 inches (10-15 cm). You should feel the muscles in your outer hip lifting. ? Do not let your foot drift forward. ? Do not let your knee roll toward the ceiling. 4. Hold this position for __________ seconds. 5. Slowly return your leg to the starting position. 6. Let your muscles relax completely after each repetition. Repeat __________ times. Complete this exercise __________ times a day. Exercise L: Straight Leg Raises - Hip Extensors 1. Lie on your abdomen on a firm surface. You can put a pillow under your hips if that is more comfortable. 2. Tense the muscles in your buttocks and lift your left / right leg about 4-6 inches (10-15 cm). Keep your knee straight as you lift your leg. 3. Hold this position for __________ seconds. 4. Slowly lower your leg to the starting position. 5. Let your leg relax completely after each repetition. Repeat __________ times. Complete this exercise __________ times a day. This information is not intended to replace advice given to you by your health care provider. Make sure you discuss any questions you have with your health care provider. Document Released: 07/07/2005 Document Revised: 05/17/2016 Document Reviewed: 06/29/2015 Elsevier Interactive Patient Education  2018 [employer].

## 2017-09-26 ENCOUNTER — Telehealth: Payer: Self-pay | Admitting: Physician Assistant

## 2017-09-26 NOTE — Progress Notes (Signed)
Call pt: negative xray of left knee. If no improvement in next week lets proceed with MRI of left knee.

## 2017-09-26 NOTE — Telephone Encounter (Signed)
Please call to get eye exam [place].

## 2017-09-27 NOTE — Telephone Encounter (Signed)
Fax request placed with front office.

## 2017-10-13 LAB — HM PAP SMEAR: HM Pap smear: NEGATIVE

## 2017-10-14 ENCOUNTER — Other Ambulatory Visit: Payer: Self-pay | Admitting: Physician Assistant

## 2017-10-14 DIAGNOSIS — E119 Type 2 diabetes mellitus without complications: Secondary | ICD-10-CM

## 2017-10-21 ENCOUNTER — Encounter: Payer: Self-pay | Admitting: Physician Assistant

## 2017-10-21 ENCOUNTER — Telehealth: Payer: Self-pay

## 2017-10-21 MED ORDER — FLUTICASONE PROPIONATE 50 MCG/ACT NA SUSP: NASAL | 3 refills | Status: DC

## 2017-10-21 MED ORDER — AZITHROMYCIN 250 MG PO TABS: ORAL_TABLET | ORAL | 0 refills | Status: DC

## 2017-10-21 NOTE — Telephone Encounter (Signed)
Entered message in wrong patient's chart. Called and cancelled the two prescriptions that have been sent to the pharmacy.

## 2017-10-21 NOTE — Telephone Encounter (Signed)
Sounds like a sinus infection, adding intranasal fluticasone and azithromycin.  Keep follow-up with PCP.

## 2017-10-21 NOTE — Telephone Encounter (Signed)
Tina Mcgrath called and states she has been taking OTC, coricidin and antihistamines. She still has a runny nose and pressure in face even after a week. Denies fever, chills, sore throat or cough. Please advise.

## 2017-12-20 ENCOUNTER — Encounter: Payer: Self-pay | Admitting: Physician Assistant

## 2017-12-20 ENCOUNTER — Ambulatory Visit: Payer: BLUE CROSS/BLUE SHIELD | Admitting: Physician Assistant

## 2017-12-20 ENCOUNTER — Telehealth: Payer: Self-pay | Admitting: Physician Assistant

## 2017-12-20 VITALS — BP 126/80 | HR 95 | Ht 62.5 in | Wt 199.0 lb

## 2017-12-20 DIAGNOSIS — E1169 Type 2 diabetes mellitus with other specified complication: Secondary | ICD-10-CM | POA: Diagnosis not present

## 2017-12-20 DIAGNOSIS — Z6835 Body mass index (BMI) 35.0-35.9, adult: Secondary | ICD-10-CM | POA: Diagnosis not present

## 2017-12-20 DIAGNOSIS — E119 Type 2 diabetes mellitus without complications: Secondary | ICD-10-CM | POA: Diagnosis not present

## 2017-12-20 DIAGNOSIS — E559 Vitamin D deficiency, unspecified: Secondary | ICD-10-CM

## 2017-12-20 DIAGNOSIS — E785 Hyperlipidemia, unspecified: Secondary | ICD-10-CM | POA: Diagnosis not present

## 2017-12-20 LAB — POCT GLYCOSYLATED HEMOGLOBIN (HGB A1C): Hemoglobin A1C: 8

## 2017-12-20 MED ORDER — SITAGLIPTIN PHOSPHATE 100 MG PO TABS: 100.0000 mg | ORAL_TABLET | Freq: Every day | ORAL | 2 refills | Status: DC

## 2017-12-20 MED ORDER — DULAGLUTIDE 1.5 MG/0.5ML ~~LOC~~ SOAJ: 1.0000 "pen " | SUBCUTANEOUS | 2 refills | Status: DC

## 2017-12-20 NOTE — Progress Notes (Deleted)
24 Pap-[facility]

## 2017-12-20 NOTE — Progress Notes (Signed)
Subjective:    Patient ID: Tina Mcgrath, female    DOB: 1956-04-30, 62 y.o.   MRN: [id number]  HPI Tina Mcgrath presents today for her 3 months diabetes follow up. She states that she has been taking her medicines as prescribed, and has been dieting and walking some. Her A1C was 10.5 on 09/23/2017. Since then she has lost about 25 pounds and is walking ~1/2 mile per day. She has also been cooking at home to eat healthier. She denies any specific complaints, no hypoglycemic episodes. She has not been checking her sugars at home. She also states that her knee pain is much improved.  .. Active Ambulatory Problems    Diagnosis Date Noted  . Hyperlipidemia 08/19/2014  . Elevated vitamin B12 level 08/19/2014  . Hematuria 08/19/2014  . Primary osteoarthritis of left knee 08/19/2014  . Dry eyes 08/19/2014  . Migraine without aura and with status migrainosus, not intractable 08/19/2014  . Mild sleep apnea 08/19/2014  . Fecal incontinence 08/19/2014  . Elevated triglycerides with high cholesterol 09/03/2014  . Controlled diabetes mellitus type II without complication ([place]) 22/10/5425  . Proteinuria 09/10/2014  . Breast cancer ([place]) 11/13/2014  . Elevated blood pressure 11/13/2014  . Chronic cystitis 01/06/2015  . Vitamin D deficiency 08/27/2015  . Malignant neoplasm of right female breast ([place]) 11/04/2015  . Post-viral cough syndrome 11/04/2015  . Prediabetes 11/21/2015  . Obesity 03/02/2016  . Cough 03/02/2016  . Carpal tunnel syndrome on both sides 08/23/2016  . Ulnar neuropathy of both upper extremities 08/23/2016  . Glaucoma suspect of both eyes 08/25/2016  . Elevated liver enzymes 11/28/2016  . Onychomycosis 11/28/2016  . Fatty liver disease, nonalcoholic 02/27/7627  . Hyperlipidemia associated with type 2 diabetes mellitus ([place]) 12/20/2017   Resolved Ambulatory Problems    Diagnosis Date Noted  . Impaired fasting glucose 08/19/2014   Past Medical History:  Diagnosis Date  . Cancer  ([place])   . Dry eyes   . HSV-2 (herpes simplex virus 2) infection   . Migraines   . Osteoarthritis   . Rosacea   . Sleep apnea       Review of Systems  Eyes: Negative for visual disturbance.  Respiratory: Negative for shortness of breath.   Gastrointestinal: Negative for nausea and vomiting.  Endocrine: Negative for polydipsia, polyphagia and polyuria.  Musculoskeletal: Negative for arthralgias and joint swelling.  Neurological: Negative for dizziness, tremors, syncope and headaches.       Objective:   Physical Exam  Constitutional: She is oriented to person, place, and time. She appears well-developed and well-nourished. No distress.  HENT:  Head: Normocephalic and atraumatic.  Eyes: Conjunctivae are normal.  Neck: Normal range of motion. Neck supple.  Cardiovascular: Normal rate, regular rhythm and normal heart sounds.  Pulmonary/Chest: Effort normal and breath sounds normal. No respiratory distress. She has no wheezes.  Musculoskeletal: Normal range of motion.  Neurological: She is alert and oriented to person, place, and time.  Skin: Skin is warm and dry.  Psychiatric: She has a normal mood and affect. Her behavior is normal.  Nursing note and vitals reviewed.         Assessment & Plan:  [name] KitchenMarland KitchenDiagnoses and all orders for this visit:  Controlled type 2 diabetes mellitus without complication, without long-term current use of insulin (HCC) -     Dulaglutide (TRULICITY) 1.5 BT/5.1VO SOPN; Inject 1 pen into the skin once a week. -     sitaGLIPtin (JANUVIA) 100 MG tablet; Take 1 tablet (100 mg  total) by mouth daily. -     COMPLETE METABOLIC PANEL WITH GFR -     POCT HgB A1C  Hyperlipidemia associated with type 2 diabetes mellitus (HCC) -     Lipid Panel w/reflex Direct LDL  Vitamin D deficiency -     Vitamin D 1,25 dihydroxy  Class 2 severe obesity due to excess calories with serious comorbidity and body mass index (BMI) of 35.0 to 35.9 in adult (HCC) -      TSH   .[name] Results for orders placed or performed in visit on 12/20/17  POCT HgB A1C  Result Value Ref Range   Hemoglobin A1C 8.0    A1C is much better. Doing well with weight loss.  Increase trulicity to 1.5mg  weekly.  Continue on januvia.  On ACE. BP controlled today.  Ordered lipid today.  Follow up in 3 months.  Up to date on vaccines and eye exam.   Labs ordered.

## 2017-12-20 NOTE — Telephone Encounter (Signed)
Dr. Lynnette Caffey pap at [facility].

## 2017-12-21 ENCOUNTER — Encounter: Payer: Self-pay | Admitting: Podiatry

## 2017-12-21 ENCOUNTER — Ambulatory Visit: Payer: BLUE CROSS/BLUE SHIELD | Admitting: Podiatry

## 2017-12-21 DIAGNOSIS — L600 Ingrowing nail: Secondary | ICD-10-CM | POA: Diagnosis not present

## 2017-12-21 DIAGNOSIS — B351 Tinea unguium: Secondary | ICD-10-CM

## 2017-12-21 DIAGNOSIS — M79672 Pain in left foot: Secondary | ICD-10-CM | POA: Diagnosis not present

## 2017-12-21 DIAGNOSIS — M79671 Pain in right foot: Secondary | ICD-10-CM

## 2017-12-21 NOTE — Telephone Encounter (Signed)
Fax request placed with front office.

## 2017-12-21 NOTE — Patient Instructions (Signed)
Seen for hypertrophic nails. All nails debrided. Return in 3 months or as needed.  

## 2017-12-21 NOTE — Progress Notes (Signed)
Subjective: 63 y.o. year old female patient presents complaining of painful nails. Patient requests toe nails trimmed. Been losing weight and doing well.  Objective: Dermatologic: Thick yellow deformed nails with ingrown hallucal nails. Vascular: Pedal pulses are all palpable. Orthopedic: No growth deformities. Neurologic: All epicritic and tactile sensations grossly intact.  Assessment: Dystrophic mycotic nails x 10. Ingrown hallucal nails.  Treatment: All mycotic nails debrided.  Continue with Penlac for fungal nails. Return in 3 months or as needed.

## 2018-01-23 LAB — LIPID PANEL W/REFLEX DIRECT LDL
Cholesterol: 225 mg/dL — ABNORMAL HIGH (ref <200)
HDL: 43 mg/dL — AB (ref >50)
LDL CHOLESTEROL (CALC): 151 mg/dL — AB
NON-HDL CHOLESTEROL (CALC): 182 mg/dL — AB (ref <130)
TRIGLYCERIDES: 174 mg/dL — AB (ref <150)
Total CHOL/HDL Ratio: 5.2 (calc) — ABNORMAL HIGH (ref <5.0)

## 2018-01-23 LAB — COMPLETE METABOLIC PANEL WITH GFR
AG RATIO: 1.7 (calc) (ref 1.0–2.5)
ALBUMIN MSPROF: 4.4 g/dL (ref 3.6–5.1)
ALKALINE PHOSPHATASE (APISO): 55 U/L (ref 33–130)
ALT: 17 U/L (ref 6–29)
AST: 16 U/L (ref 10–35)
BILIRUBIN TOTAL: 0.7 mg/dL (ref 0.2–1.2)
BUN: 14 mg/dL (ref 7–25)
CO2: 25 mmol/L (ref 20–32)
Calcium: 9.7 mg/dL (ref 8.6–10.4)
Chloride: 104 mmol/L (ref 98–110)
Creat: 0.65 mg/dL (ref 0.50–1.05)
GFR, Est African American: 113 mL/min/{1.73_m2} (ref >=60)
GFR, Est Non African American: 97 mL/min/{1.73_m2} (ref >=60)
GLUCOSE: 115 mg/dL — AB (ref 65–99)
Globulin: 2.6 g/dL (calc) (ref 1.9–3.7)
POTASSIUM: 4.3 mmol/L (ref 3.5–5.3)
Sodium: 140 mmol/L (ref 135–146)
Total Protein: 7 g/dL (ref 6.1–8.1)

## 2018-01-23 LAB — VITAMIN D 1,25 DIHYDROXY
Vitamin D 1, 25 (OH)2 Total: 25 pg/mL (ref 18–72)
Vitamin D2 1, 25 (OH)2: <8 pg/mL
Vitamin D3 1, 25 (OH)2: 25 pg/mL

## 2018-01-23 LAB — TSH: TSH: 4.35 mIU/L (ref 0.40–4.50)

## 2018-01-25 ENCOUNTER — Encounter: Payer: Self-pay | Admitting: Physician Assistant

## 2018-01-25 NOTE — Progress Notes (Signed)
Call pt: cholesterol is up from 1 year ago. HDL lower and LDL higher. TG mildly elevated. You do not tolerate statins but I feel you need to be on something. Have you ever tried welchol?   Thyroid is more elevated that last recheck. You are on the upper limits of normal. Reasonable to increase thyroid medication. Are you ok with this?   Vitamin d is still low. 5,000units daily is what is on med list is this is what you are taking?

## 2018-01-31 ENCOUNTER — Encounter: Payer: Self-pay | Admitting: Physician Assistant

## 2018-01-31 ENCOUNTER — Other Ambulatory Visit: Payer: Self-pay | Admitting: Physician Assistant

## 2018-01-31 MED ORDER — COLESEVELAM HCL 625 MG PO TABS: 1875.0000 mg | ORAL_TABLET | Freq: Two times a day (BID) | ORAL | 5 refills | Status: DC

## 2018-02-13 ENCOUNTER — Telehealth: Payer: Self-pay | Admitting: Physician Assistant

## 2018-02-13 NOTE — Telephone Encounter (Signed)
Received fax from [employer] that [place] requires a PA. Information has been sent to the insurance company. Awaiting determination.

## 2018-02-13 NOTE — Telephone Encounter (Signed)
Approvedtoday Januvia.02/13/2018 Your PA request has been approved. Additional information will be provided in the approval communication.

## 2018-02-24 NOTE — Telephone Encounter (Signed)
received a notification from [employer] that Welchol was not covered under patients plan. Please advise.

## 2018-02-27 NOTE — Telephone Encounter (Signed)
Let pt know that welchol not approved much like other medications we could prescribe. We could try the new PSK injections but I fear they will not be approved as well.

## 2018-02-27 NOTE — Telephone Encounter (Signed)
Spoke with patient and she states that she received a letter in the mail that her medication would be covered. Patient will bring this in to her visit on 03/21/2018 to discuss with PCP. -HSM.

## 2018-03-15 ENCOUNTER — Ambulatory Visit: Payer: BLUE CROSS/BLUE SHIELD | Admitting: Podiatry

## 2018-03-20 LAB — HM MAMMOGRAPHY

## 2018-03-21 ENCOUNTER — Ambulatory Visit: Payer: BLUE CROSS/BLUE SHIELD | Admitting: Physician Assistant

## 2018-03-21 ENCOUNTER — Encounter: Payer: Self-pay | Admitting: Physician Assistant

## 2018-03-21 ENCOUNTER — Ambulatory Visit (INDEPENDENT_AMBULATORY_CARE_PROVIDER_SITE_OTHER): Payer: BLUE CROSS/BLUE SHIELD

## 2018-03-21 VITALS — BP 137/78 | HR 103 | Wt 187.0 lb

## 2018-03-21 DIAGNOSIS — R1031 Right lower quadrant pain: Secondary | ICD-10-CM

## 2018-03-21 DIAGNOSIS — Z1231 Encounter for screening mammogram for malignant neoplasm of breast: Secondary | ICD-10-CM

## 2018-03-21 DIAGNOSIS — R059 Cough, unspecified: Secondary | ICD-10-CM

## 2018-03-21 DIAGNOSIS — E785 Hyperlipidemia, unspecified: Secondary | ICD-10-CM | POA: Diagnosis not present

## 2018-03-21 DIAGNOSIS — E119 Type 2 diabetes mellitus without complications: Secondary | ICD-10-CM | POA: Diagnosis not present

## 2018-03-21 DIAGNOSIS — M1611 Unilateral primary osteoarthritis, right hip: Secondary | ICD-10-CM

## 2018-03-21 DIAGNOSIS — R05 Cough: Secondary | ICD-10-CM | POA: Diagnosis not present

## 2018-03-21 LAB — POCT GLYCOSYLATED HEMOGLOBIN (HGB A1C): HEMOGLOBIN A1C: 5.5 % (ref 4.0–5.6)

## 2018-03-21 MED ORDER — COLESEVELAM HCL 625 MG PO TABS: 1875.0000 mg | ORAL_TABLET | Freq: Two times a day (BID) | ORAL | 4 refills | Status: DC

## 2018-03-21 MED ORDER — SITAGLIPTIN PHOSPHATE 100 MG PO TABS: 100.0000 mg | ORAL_TABLET | Freq: Every day | ORAL | 0 refills | Status: DC

## 2018-03-21 MED ORDER — DULAGLUTIDE 1.5 MG/0.5ML ~~LOC~~ SOAJ: 1.0000 "pen " | SUBCUTANEOUS | 0 refills | Status: DC

## 2018-03-21 NOTE — Progress Notes (Signed)
Subjective:    Patient ID: Markisha Meding, female    DOB: [birth year], 62 y.o.   MRN: [id number]  HPI  Pt is a 62 yo female who presents to the clinic for T2DM follow up.   .. Active Ambulatory Problems    Diagnosis Date Noted  . Hyperlipidemia 08/19/2014  . Elevated vitamin B12 level 08/19/2014  . Hematuria 08/19/2014  . Primary osteoarthritis of left knee 08/19/2014  . Dry eyes 08/19/2014  . Migraine without aura and with status migrainosus, not intractable 08/19/2014  . Mild sleep apnea 08/19/2014  . Fecal incontinence 08/19/2014  . Elevated triglycerides with high cholesterol 09/03/2014  . Controlled diabetes mellitus type II without complication ([place]) 87/68/1157  . Proteinuria 09/10/2014  . Breast cancer ([place]) 11/13/2014  . Elevated blood pressure 11/13/2014  . Chronic cystitis 01/06/2015  . Vitamin D deficiency 08/27/2015  . Malignant neoplasm of right female breast ([place]) 11/04/2015  . Obesity 03/02/2016  . Cough 03/02/2016  . Carpal tunnel syndrome on both sides 08/23/2016  . Ulnar neuropathy of both upper extremities 08/23/2016  . Glaucoma suspect of both eyes 08/25/2016  . Elevated liver enzymes 11/28/2016  . Onychomycosis 11/28/2016  . Fatty liver disease, nonalcoholic 26/20/3559  . Hyperlipidemia associated with type 2 diabetes mellitus ([place]) 12/20/2017  . Right groin pain 03/23/2018  . Osteoarthritis of right hip 03/23/2018   Resolved Ambulatory Problems    Diagnosis Date Noted  . Impaired fasting glucose 08/19/2014  . Post-viral cough syndrome 11/04/2015  . Prediabetes 11/21/2015   Past Medical History:  Diagnosis Date  . Cancer ([place])   . Dry eyes   . HSV-2 (herpes simplex virus 2) infection   . Migraines   . Osteoarthritis   . Rosacea   . Sleep apnea    Pt is doing great. She is taking her trulicity, januiva, and welchol. She is watching what she is eating and trying to walk. Her last a1c was 8. She is down 12lbs in 3 months. She denies any  hypoglycemia. No open sores or wounds.   She has noticed a "heaviness in her anterior upper thigh" of the right leg. Seems to "settle in the groin". Notices it when she walks. Her ability to walk is very limited. Symptoms for the last year off and on. Seems to be worsening with weight loss. She has had her left hip replaced in 2012 due to side effects of radiation and chemotherapy.   She continues to have a productive cough first thing in the morning for 20 minutes and then cleares. She is taking claritin.     Review of Systems    see HPI.  Objective:   Physical Exam  Constitutional: She is oriented to person, place, and time. She appears well-developed and well-nourished.  Obese.   HENT:  Head: Normocephalic and atraumatic.  Cardiovascular: Normal rate and regular rhythm.  Pulmonary/Chest: Effort normal and breath sounds normal. She has no wheezes. She exhibits no tenderness.  Musculoskeletal:  Right leg pain with internal rotation.  No pain over greater trochanter to palpation.  Strength of lower extremity is 5/5.   Neurological: She is alert and oriented to person, place, and time.  Skin: No rash noted.          Assessment & Plan:  [name] KitchenMarland KitchenClotee was seen today for diabetes.  Diagnoses and all orders for this visit:  Controlled type 2 diabetes mellitus without complication, without long-term current use of insulin (HCC) -     POCT HgB A1C -  Dulaglutide (TRULICITY) 1.5 MG/8.6PY SOPN; Inject 1 pen into the skin once a week. -     sitaGLIPtin (JANUVIA) 100 MG tablet; Take 1 tablet (100 mg total) by mouth daily.  Right groin pain -     DG HIP UNILAT WITH PELVIS 2-3 VIEWS RIGHT  Hyperlipidemia, unspecified hyperlipidemia type -     colesevelam (WELCHOL) 625 MG tablet; Take 3 tablets (1,875 mg total) by mouth 2 (two) times daily with a meal.  Visit for screening mammogram -     MM 3D SCREEN BREAST BILATERAL  Osteoarthritis of right hip, unspecified osteoarthritis  type  Cough   .[name] Results for orders placed or performed in visit on 03/21/18  POCT HgB A1C  Result Value Ref Range   Hemoglobin A1C 5.5 4.0 - 5.6 %   HbA1c POC (<> result, manual entry)  4.0 - 5.6 %   HbA1c, POC (prediabetic range)  5.7 - 6.4 %   HbA1c, POC (controlled diabetic range)  0.0 - 7.0 %   A!C is great made wonderful changes. Stay on same medications. Refilled today.  Not tolerant to statins. On welchol.  Declines all vaccines.  Declined shingles vaccine.  BP not to goal of 130/90.   Xray of hip confirmed OA. Pt will follow up with ortho. Likely start with injections and go from there. Can't tolerate NSAIDs. Consider tumeric. Continue to lose weight. Discussed PT to strengthen muscles around it.   Cough- reassuring that clears and only in am. On antihistamine. Consider mucinex and some decongestant as needed.

## 2018-03-21 NOTE — Progress Notes (Signed)
Call pt: you do have right hip arthritis. I would suggest considering an injection since effecting your ability to walk. I would follow up with ortho. We could order some PT to get you started as well. Anti-inflammatories are prescribed a lot in these cases. Your kidney function looks good. Do oral NSAIDs cause your GERD to be worse? Also tumeric can help with inflammation.

## 2018-03-23 DIAGNOSIS — R1031 Right lower quadrant pain: Secondary | ICD-10-CM | POA: Insufficient documentation

## 2018-03-23 DIAGNOSIS — M1611 Unilateral primary osteoarthritis, right hip: Secondary | ICD-10-CM | POA: Insufficient documentation

## 2018-04-13 ENCOUNTER — Encounter: Payer: Self-pay | Admitting: Physician Assistant

## 2018-06-21 ENCOUNTER — Ambulatory Visit: Payer: BLUE CROSS/BLUE SHIELD | Admitting: Physician Assistant

## 2018-06-28 ENCOUNTER — Encounter: Payer: Self-pay | Admitting: Family Medicine

## 2018-06-28 ENCOUNTER — Encounter: Payer: Self-pay | Admitting: Physician Assistant

## 2018-06-28 ENCOUNTER — Ambulatory Visit: Payer: BLUE CROSS/BLUE SHIELD | Admitting: Physician Assistant

## 2018-06-28 ENCOUNTER — Ambulatory Visit (INDEPENDENT_AMBULATORY_CARE_PROVIDER_SITE_OTHER): Payer: BLUE CROSS/BLUE SHIELD | Admitting: Family Medicine

## 2018-06-28 VITALS — BP 133/93 | HR 94 | Ht 62.5 in | Wt 176.0 lb

## 2018-06-28 DIAGNOSIS — F4321 Adjustment disorder with depressed mood: Secondary | ICD-10-CM | POA: Diagnosis not present

## 2018-06-28 DIAGNOSIS — M1611 Unilateral primary osteoarthritis, right hip: Secondary | ICD-10-CM

## 2018-06-28 DIAGNOSIS — Z853 Personal history of malignant neoplasm of breast: Secondary | ICD-10-CM

## 2018-06-28 DIAGNOSIS — E6609 Other obesity due to excess calories: Secondary | ICD-10-CM

## 2018-06-28 DIAGNOSIS — Z789 Other specified health status: Secondary | ICD-10-CM

## 2018-06-28 DIAGNOSIS — M76891 Other specified enthesopathies of right lower limb, excluding foot: Secondary | ICD-10-CM

## 2018-06-28 DIAGNOSIS — E119 Type 2 diabetes mellitus without complications: Secondary | ICD-10-CM | POA: Diagnosis not present

## 2018-06-28 DIAGNOSIS — G479 Sleep disorder, unspecified: Secondary | ICD-10-CM | POA: Insufficient documentation

## 2018-06-28 DIAGNOSIS — Z6831 Body mass index (BMI) 31.0-31.9, adult: Secondary | ICD-10-CM

## 2018-06-28 DIAGNOSIS — F4320 Adjustment disorder, unspecified: Secondary | ICD-10-CM

## 2018-06-28 LAB — POCT UA - MICROALBUMIN
CREATININE, POC: 50 mg/dL
Microalbumin Ur, POC: 30 mg/L

## 2018-06-28 LAB — POCT GLYCOSYLATED HEMOGLOBIN (HGB A1C): Hemoglobin A1C: 5.5 % (ref 4.0–5.6)

## 2018-06-28 MED ORDER — SITAGLIPTIN PHOSPHATE 100 MG PO TABS: 100.0000 mg | ORAL_TABLET | Freq: Every day | ORAL | 0 refills | Status: DC

## 2018-06-28 MED ORDER — DULAGLUTIDE 1.5 MG/0.5ML ~~LOC~~ SOAJ
1.0000 | SUBCUTANEOUS | 0 refills | Status: DC
Start: 2018-06-28 — End: 2018-09-27

## 2018-06-28 MED ORDER — DULAGLUTIDE 1.5 MG/0.5ML ~~LOC~~ SOAJ: 1.0000 "pen " | SUBCUTANEOUS | 0 refills | Status: DC

## 2018-06-28 NOTE — Patient Instructions (Addendum)
Thank you for coming in today. Work on hip flexor stretching and strengthing.   Laying on stomach. Use stretching strap to pull leg up with hip in extension.   For strength laying on back keeping the leg straight raise the foot about 1 foot into the air.  Do about 30 reps 2x daily.  Do this with the foot pointing up and pointing to the outside.   You can also find on youtube some other good exercises that you may want to try.   I do recommend PT but we can wait and try it on your own.   Hip Flexor.   Recheck with me in 6 weeks or sooner if needed.

## 2018-06-28 NOTE — Patient Instructions (Addendum)
Dr. Georgina Snell today for right hip OA.

## 2018-06-28 NOTE — Progress Notes (Signed)
Subjective:    Patient ID: Tina Mcgrath, female    DOB: Jan 31, 1956, 62 y.o.   MRN: [id number]  HPI  Pt is a 62 yo obese female with T2DM, right hip OA, migraines, HLD who presents to the clinic for 3 month follow up.   DM- she is doing great. Not checking sugars regularly. Working on losing weight. Down 11lbs. Taking probiotics and green extract daily. Denies any hypoglycemia. No open sores or wounds. Eating really clean and walking.   Continues to have right hip pain. Tumeric not really helping. Xray confirmed OA.   Migraines-controlled.   Pt son died tragically in motorcycle accident. ofcourse she is very upset. She finds herself emotional. Denies any SI/HC. She is having problems sleeping.   .. Active Ambulatory Problems    Diagnosis Date Noted  . Hyperlipidemia 08/19/2014  . Elevated vitamin B12 level 08/19/2014  . Hematuria 08/19/2014  . Primary osteoarthritis of left knee 08/19/2014  . Dry eyes 08/19/2014  . Migraine without aura and with status migrainosus, not intractable 08/19/2014  . Mild sleep apnea 08/19/2014  . Fecal incontinence 08/19/2014  . Elevated triglycerides with high cholesterol 09/03/2014  . Controlled diabetes mellitus type II without complication ([place]) 02/77/4128  . Proteinuria 09/10/2014  . Elevated blood pressure 11/13/2014  . Chronic cystitis 01/06/2015  . Vitamin D deficiency 08/27/2015  . History of cancer of right breast 11/04/2015  . Obesity 03/02/2016  . Cough 03/02/2016  . Carpal tunnel syndrome on both sides 08/23/2016  . Ulnar neuropathy of both upper extremities 08/23/2016  . Glaucoma suspect of both eyes 08/25/2016  . Elevated liver enzymes 11/28/2016  . Onychomycosis 11/28/2016  . Fatty liver disease, nonalcoholic 78/67/6720  . Hyperlipidemia associated with type 2 diabetes mellitus ([place]) 12/20/2017  . Osteoarthritis of right hip 03/23/2018  . Trouble in sleeping 06/28/2018  . Grief reaction 06/28/2018  . Hip flexor tendinitis, right  06/28/2018  . Statin intolerance 06/29/2018   Resolved Ambulatory Problems    Diagnosis Date Noted  . Impaired fasting glucose 08/19/2014  . Breast cancer ([place]) 11/13/2014  . Post-viral cough syndrome 11/04/2015  . Prediabetes 11/21/2015  . Right groin pain 03/23/2018   Past Medical History:  Diagnosis Date  . Cancer ([place])   . HSV-2 (herpes simplex virus 2) infection   . Migraines   . Osteoarthritis   . Rosacea   . Sleep apnea        Review of Systems    see HPI.  Objective:   Physical Exam  Constitutional: She is oriented to person, place, and time. She appears well-developed and well-nourished.  HENT:  Head: Normocephalic and atraumatic.  Cardiovascular: Normal rate and regular rhythm.  Pulmonary/Chest: Effort normal and breath sounds normal.  Neurological: She is alert and oriented to person, place, and time.  Psychiatric: She has a normal mood and affect. Her behavior is normal.          Assessment & Plan:   [name] KitchenMarland KitchenDiagnoses and all orders for this visit:  Controlled type 2 diabetes mellitus without complication, without long-term current use of insulin (HCC) -     POCT glycosylated hemoglobin (Hb A1C) -     Discontinue: sitaGLIPtin (JANUVIA) 100 MG tablet; Take 1 tablet (100 mg total) by mouth daily. -     Discontinue: Dulaglutide (TRULICITY) 1.5 NO/7.0JG SOPN; Inject 1 pen into the skin once a week. -     POCT UA - Microalbumin -     Dulaglutide (TRULICITY) 1.5 GE/3.6OQ SOPN; Inject 1  pen into the skin once a week. -     sitaGLIPtin (JANUVIA) 100 MG tablet; Take 1 tablet (100 mg total) by mouth daily.  Primary osteoarthritis of right hip  Trouble in sleeping  Grief reaction  Statin intolerance    .[name] Lab Results  Component Value Date   HGBA1C 5.5 06/28/2018   A!C great.  Stay on same medications. I think DM medications are also helping her lose weight.  Cannot tolerate statins.  microalbumin good.  Pt declined flu shot.  Diabetic Foot Exam -  Simple   Simple Foot Form Diabetic Foot exam was performed with the following findings:  Yes 06/28/2018  9:07 AM  Visual Inspection No deformities, no ulcerations, no other skin breakdown bilaterally:  Yes Sensation Testing Intact to touch and monofilament testing bilaterally:  Yes Pulse Check Posterior Tibialis and Dorsalis pulse intact bilaterally:  Yes Comments    Encouraged to get mammogram. It was ordered.  Dr. Lynnette Caffey had pap smear. Need to get records.   Follow up with sports medicine for hip pain. On tumeric.   Pt certainly is grieving. Reassurance this is a natural process. Discussed hospice counseling. Pt declined any medical intervention. Discussed signs that she needs to follow up. Discussed good sleep routine. Consider melatonin/unisom OTC.   Follow up in 3 months.

## 2018-06-28 NOTE — Progress Notes (Signed)
Subjective:    I'm seeing this patient as a consultation for:  Donella Stade, PA-C   CC: Right hip pain  HPI: Tina Mcgrath has a several month history of right hip pain.  Her PCP evaluated further this issue with a x-ray in July that showed only mild hip DJD.  She has a pertinent past medical history for breast cancer in 2016 and 2017 status post chemotherapy.  Additionally she has a relevant history for left total hip replacement.  She notes pain in the right anterior hip typically worse with extensive walking.  She notes that her pain worsens if she takes larger strides.  She also has pain with laying flat and with hip somewhat extended.  She feels better with her hip flexed.  She denies any radiating pain weakness or numbness.  She does not have any problems tying her shoes or getting her socks on.  She notes the pain is somewhat similar to the left hip pain prior to total hip replacement but much less bothersome.  She is tried some over-the-counter medicines for pain which helped a bit.  She has not had much therapy or exercises yet  Past medical history, Surgical history, Family history not pertinant except as noted below, Social history, Allergies, and medications have been entered into the medical record, reviewed, and no changes needed.   Review of Systems: No headache, visual changes, nausea, vomiting, diarrhea, constipation, dizziness, abdominal pain, skin rash, fevers, chills, night sweats, weight loss, swollen lymph nodes, body aches, joint swelling, muscle aches, chest pain, shortness of breath, mood changes, visual or auditory hallucinations.   Objective:    Vitals:   06/28/18 0904  BP: (!) 133/93  Pulse: 94  Body mass index is 31.68 kg/m.  General: Well Developed, well nourished, and in no acute distress.  Neuro/Psych: Alert and oriented x3, extra-ocular muscles intact, able to move all 4 extremities, sensation grossly intact. Skin: Warm and dry, no rashes noted.  Respiratory:  Not using accessory muscles, speaking in full sentences, trachea midline.  Cardiovascular: Pulses palpable, no extremity edema. Abdomen: Does not appear distended. MSK:  Right hip normal-appearing normal motion. No significant pain with hip flexion and rotation. Hip strength is diminished hip flexion 4/5.  Slightly diminished hip abduction 4+/5.  Intact hip rotation internal and external and intact hip extension 5/5 Negative Corky Sox and FADIR test.   Left hip normal-appearing nontender normal strength normal motion.    Lab and Radiology Results EXAM: DG HIP (WITH OR WITHOUT PELVIS) 2-3V RIGHT  COMPARISON:  None.  FINDINGS: Prior left hip replacement. Mild degenerative changes in the right hip with joint space narrowing and spurring. SI joints are symmetric and unremarkable. No acute bony abnormality. Specifically, no fracture, subluxation, or dislocation.  IMPRESSION: Mild degenerative changes in the right hip. No acute bony abnormality.   Electronically Signed   By: Rolm Baptise M.D.   On: 03/21/2018 09:18 I personally (independently) visualized and performed the interpretation of the images attached in this note.   Impression and Recommendations:    Assessment and Plan: 62 y.o. female with  Right hip pain very likely due to hip flexor tendinitis weakness.  Plan for home exercise program.  Patient declined formal referral to physical therapy.  Reviewed exercise protocol.  Recheck in 6 weeks.  Return sooner if needed. Given her history of breast cancer and her history of chemotherapy and total left hip replacement earlier MRI would be preferred however x-ray in July 2019 was largely reassuring.[name]  Obesity is also a contributing factor here.  Patient has lost weight which is reassuring.    Discussed warning signs or symptoms. Please see discharge instructions. Patient expresses understanding.

## 2018-06-29 ENCOUNTER — Encounter: Payer: Self-pay | Admitting: Physician Assistant

## 2018-06-29 ENCOUNTER — Telehealth: Payer: Self-pay | Admitting: Physician Assistant

## 2018-06-29 DIAGNOSIS — Z789 Other specified health status: Secondary | ICD-10-CM | POA: Insufficient documentation

## 2018-06-29 NOTE — Telephone Encounter (Signed)
Need to get pap results from Dr. Lynnette Caffey

## 2018-07-13 NOTE — Telephone Encounter (Signed)
Done. Result request has been sent to Dr. Lynnette Caffey.

## 2018-07-17 ENCOUNTER — Encounter: Payer: Self-pay | Admitting: Physician Assistant

## 2018-07-18 MED ORDER — AMOXICILLIN 500 MG PO CAPS: ORAL_CAPSULE | ORAL | 0 refills | Status: DC

## 2018-07-21 NOTE — Telephone Encounter (Signed)
Do not have up to date mammogram. Please find out where she went to get copy.

## 2018-08-09 ENCOUNTER — Ambulatory Visit: Payer: BLUE CROSS/BLUE SHIELD | Admitting: Family Medicine

## 2018-08-16 NOTE — Telephone Encounter (Signed)
Called and spoke with patient. Patient states she had her mammogram done in May, and she has the records with her at her home. Patient states she spoke with you, and you told her she can wait to bring in those records to her appointment with you in January. No further questions or concerns at this time.

## 2018-08-18 NOTE — Telephone Encounter (Signed)
Pleas call again for pap results from Dr. Lynnette Caffey. Still don't have them.

## 2018-08-21 ENCOUNTER — Encounter: Payer: Self-pay | Admitting: Physician Assistant

## 2018-08-21 NOTE — Telephone Encounter (Signed)
PAP received and scanned.

## 2018-09-27 ENCOUNTER — Ambulatory Visit: Payer: BLUE CROSS/BLUE SHIELD | Admitting: Physician Assistant

## 2018-09-27 ENCOUNTER — Encounter: Payer: Self-pay | Admitting: Physician Assistant

## 2018-09-27 VITALS — BP 127/64 | HR 95 | Ht 62.5 in | Wt 177.0 lb

## 2018-09-27 DIAGNOSIS — E119 Type 2 diabetes mellitus without complications: Secondary | ICD-10-CM | POA: Diagnosis not present

## 2018-09-27 DIAGNOSIS — Z79899 Other long term (current) drug therapy: Secondary | ICD-10-CM

## 2018-09-27 DIAGNOSIS — E785 Hyperlipidemia, unspecified: Secondary | ICD-10-CM | POA: Diagnosis not present

## 2018-09-27 LAB — POCT GLYCOSYLATED HEMOGLOBIN (HGB A1C): Hemoglobin A1C: 5.4 % (ref 4.0–5.6)

## 2018-09-27 MED ORDER — VALACYCLOVIR HCL 500 MG PO TABS: 500.0000 mg | ORAL_TABLET | ORAL | 4 refills | Status: DC | PRN

## 2018-09-27 MED ORDER — SITAGLIPTIN PHOSPHATE 100 MG PO TABS: 100.0000 mg | ORAL_TABLET | Freq: Every day | ORAL | 1 refills | Status: DC

## 2018-09-27 MED ORDER — DULAGLUTIDE 1.5 MG/0.5ML ~~LOC~~ SOAJ: 1.0000 "pen " | SUBCUTANEOUS | 0 refills | Status: DC

## 2018-09-27 NOTE — Progress Notes (Signed)
Subjective:    Patient ID: Tina Mcgrath, female    DOB: Oct 20, 1955, 63 y.o.   MRN: [id number]  HPI  Pt is a 63 yo female with T2DM, migraines, hypertriglyceridemia, dyslipidemia who presents to the clinic for 3 month follow up.   She still has good and bad days with grieving the death of her son. His birthday was yesterday. Overall she is good. She still finds pleasure in doing things. She is working a lot. She just got done with cruise to [place].   Migraines controlled.   DM- she is not checking her sugars. She is very compliant with medications. No open sores or wounds. Her weight is stable. She has lost a lot of weight but none recently.   .. Active Ambulatory Problems    Diagnosis Date Noted  . Hyperlipidemia 08/19/2014  . Elevated vitamin B12 level 08/19/2014  . Hematuria 08/19/2014  . Primary osteoarthritis of left knee 08/19/2014  . Dry eyes 08/19/2014  . Migraine without aura and with status migrainosus, not intractable 08/19/2014  . Mild sleep apnea 08/19/2014  . Fecal incontinence 08/19/2014  . Elevated triglycerides with high cholesterol 09/03/2014  . Controlled diabetes mellitus type II without complication ([place]) 33/29/5188  . Proteinuria 09/10/2014  . Elevated blood pressure 11/13/2014  . Chronic cystitis 01/06/2015  . Vitamin D deficiency 08/27/2015  . History of cancer of right breast 11/04/2015  . Obesity 03/02/2016  . Cough 03/02/2016  . Carpal tunnel syndrome on both sides 08/23/2016  . Ulnar neuropathy of both upper extremities 08/23/2016  . Glaucoma suspect of both eyes 08/25/2016  . Elevated liver enzymes 11/28/2016  . Onychomycosis 11/28/2016  . Fatty liver disease, nonalcoholic 41/66/0630  . Hyperlipidemia associated with type 2 diabetes mellitus ([place]) 12/20/2017  . Osteoarthritis of right hip 03/23/2018  . Trouble in sleeping 06/28/2018  . Grief reaction 06/28/2018  . Hip flexor tendinitis, right 06/28/2018  . Statin intolerance 06/29/2018    Resolved Ambulatory Problems    Diagnosis Date Noted  . Impaired fasting glucose 08/19/2014  . Breast cancer ([place]) 11/13/2014  . Post-viral cough syndrome 11/04/2015  . Prediabetes 11/21/2015  . Right groin pain 03/23/2018   Past Medical History:  Diagnosis Date  . Cancer ([place])   . HSV-2 (herpes simplex virus 2) infection   . Migraines   . Osteoarthritis   . Rosacea   . Sleep apnea     Review of Systems  All other systems reviewed and are negative.      Objective:   Physical Exam Vitals signs reviewed.  Constitutional:      Appearance: Normal appearance.  HENT:     Head: Normocephalic and atraumatic.  Cardiovascular:     Rate and Rhythm: Normal rate and regular rhythm.     Pulses: Normal pulses.  Pulmonary:     Effort: Pulmonary effort is normal.     Breath sounds: Normal breath sounds.  Abdominal:     Tenderness: There is no abdominal tenderness.     Hernia: No hernia is present.  Skin:    General: Skin is warm.  Neurological:     General: No focal deficit present.     Mental Status: She is alert and oriented to person, place, and time.  Psychiatric:        Mood and Affect: Mood normal.        Behavior: Behavior normal.           Assessment & Plan:  [name] KitchenMarland KitchenDenina was seen today for follow-up.  Diagnoses and all orders for this visit:  Controlled type 2 diabetes mellitus without complication, without long-term current use of insulin (HCC) -     POCT glycosylated hemoglobin (Hb A1C) -     sitaGLIPtin (JANUVIA) 100 MG tablet; Take 1 tablet (100 mg total) by mouth daily. -     Dulaglutide (TRULICITY) 1.5 RW/4.3XV SOPN; Inject 1 pen into the skin once a week. -     COMPLETE METABOLIC PANEL WITH GFR  Dyslipidemia (high LDL; low HDL) -     Lipid Panel w/reflex Direct LDL  Medication management -     COMPLETE METABOLIC PANEL WITH GFR  Other orders -     valACYclovir (VALTREX) 500 MG tablet; Take 1 tablet (500 mg total) by mouth as  needed.   .. Results for orders placed or performed in visit on 09/27/18  POCT glycosylated hemoglobin (Hb A1C)  Result Value Ref Range   Hemoglobin A1C 5.4 4.0 - 5.6 %   HbA1c POC (<> result, manual entry)     HbA1c, POC (prediabetic range)     HbA1c, POC (controlled diabetic range)     a1c looks great.  Continue medications. Pt questions coming off trulicity and her Q0G is good enough but warned her about potential weight gain coming off trulicity.  Statin intolerant. On welchol.  BP controlled.  Pt reports having eye exam but we do not have copy.  Vaccines up to date.   Fasting labs ordered.

## 2018-09-28 ENCOUNTER — Encounter: Payer: Self-pay | Admitting: Physician Assistant

## 2018-09-28 ENCOUNTER — Telehealth: Payer: Self-pay | Admitting: Physician Assistant

## 2018-09-28 NOTE — Telephone Encounter (Signed)
Pt called clinic stating she took her husbands medications this morning by accident instead of hers. She reports taking his: ranolazine 500mg , metoprolol 12.5mg , fish oil, and Crestor. Per PCP signs to look out for are low pulse, orthostatic symptoms, possible decrease in blood pressure. Advised to increase fluids and salt intake today. Pt verbalized understanding. Reports being asymptomatic at this time. Advised if she develops any shortness of breath, chest pain, extreme drop in blood pressure or pulse - then she should seek emergent care. Verbalized understanding. No further questions.

## 2018-09-30 LAB — COMPLETE METABOLIC PANEL WITH GFR
AG Ratio: 1.7 (calc) (ref 1.0–2.5)
ALT: 12 U/L (ref 6–29)
AST: 16 U/L (ref 10–35)
Albumin: 4.4 g/dL (ref 3.6–5.1)
Alkaline phosphatase (APISO): 64 U/L (ref 33–130)
BUN: 11 mg/dL (ref 7–25)
CALCIUM: 9.9 mg/dL (ref 8.6–10.4)
CO2: 23 mmol/L (ref 20–32)
Chloride: 104 mmol/L (ref 98–110)
Creat: 0.74 mg/dL (ref 0.50–0.99)
GFR, Est African American: 102 mL/min/{1.73_m2} (ref >=60)
GFR, Est Non African American: 88 mL/min/{1.73_m2} (ref >=60)
Globulin: 2.6 g/dL (calc) (ref 1.9–3.7)
Glucose, Bld: 97 mg/dL (ref 65–99)
Potassium: 4.2 mmol/L (ref 3.5–5.3)
SODIUM: 139 mmol/L (ref 135–146)
TOTAL PROTEIN: 7 g/dL (ref 6.1–8.1)
Total Bilirubin: 0.8 mg/dL (ref 0.2–1.2)

## 2018-09-30 LAB — LIPID PANEL W/REFLEX DIRECT LDL
CHOLESTEROL: 200 mg/dL — AB (ref <200)
HDL: 59 mg/dL (ref >50)
LDL Cholesterol (Calc): 119 mg/dL (calc) — ABNORMAL HIGH
Non-HDL Cholesterol (Calc): 141 mg/dL (calc) — ABNORMAL HIGH (ref <130)
Total CHOL/HDL Ratio: 3.4 (calc) (ref <5.0)
Triglycerides: 112 mg/dL (ref <150)

## 2018-10-02 NOTE — Progress Notes (Signed)
Call pt: cholesterol improved. HDL, LDL, TG improved. Will send letter to insurance company to try to get welchol continued to be approved.  Labs look good.   Barn[facility] Pall: I will place letter that needs to go to insurance company for approval.

## 2018-10-04 NOTE — Telephone Encounter (Signed)
Welchol has been approved through 10/04/2019. Pharmacy aware and forms sent to scan.

## 2018-12-13 ENCOUNTER — Telehealth: Payer: Self-pay | Admitting: Physician Assistant

## 2018-12-13 DIAGNOSIS — E119 Type 2 diabetes mellitus without complications: Secondary | ICD-10-CM

## 2018-12-13 NOTE — Telephone Encounter (Signed)
Lab placed for upcoming appt.

## 2018-12-17 ENCOUNTER — Other Ambulatory Visit: Payer: Self-pay | Admitting: Physician Assistant

## 2018-12-17 DIAGNOSIS — E119 Type 2 diabetes mellitus without complications: Secondary | ICD-10-CM

## 2018-12-26 LAB — HEMOGLOBIN A1C
Hgb A1c MFr Bld: 5.5 %{Hb}
Mean Plasma Glucose: 111 (calc)
eAG (mmol/L): 6.2 (calc)

## 2018-12-26 NOTE — Telephone Encounter (Signed)
Great A!C stable. Can discuss in full at appt!

## 2018-12-27 ENCOUNTER — Telehealth (INDEPENDENT_AMBULATORY_CARE_PROVIDER_SITE_OTHER): Payer: BLUE CROSS/BLUE SHIELD | Admitting: Physician Assistant

## 2018-12-27 ENCOUNTER — Encounter: Payer: Self-pay | Admitting: Physician Assistant

## 2018-12-27 VITALS — BP 120/83 | HR 94 | Temp 97.5°F | Ht 62.5 in | Wt 175.0 lb

## 2018-12-27 DIAGNOSIS — M25551 Pain in right hip: Secondary | ICD-10-CM | POA: Diagnosis not present

## 2018-12-27 DIAGNOSIS — M1611 Unilateral primary osteoarthritis, right hip: Secondary | ICD-10-CM

## 2018-12-27 DIAGNOSIS — E119 Type 2 diabetes mellitus without complications: Secondary | ICD-10-CM | POA: Diagnosis not present

## 2018-12-27 MED ORDER — SITAGLIPTIN PHOSPHATE 100 MG PO TABS: 100.0000 mg | ORAL_TABLET | Freq: Every day | ORAL | 1 refills | Status: DC

## 2018-12-27 MED ORDER — DULAGLUTIDE 1.5 MG/0.5ML ~~LOC~~ SOAJ: 1.0000 "pen " | SUBCUTANEOUS | 0 refills | Status: DC

## 2018-12-27 NOTE — Progress Notes (Signed)
Patient ID: Tina Mcgrath, female   DOB: December 21, 1955, 63 y.o.   MRN: [id number]  .[name] KitchenVirtual Visit via Video Note  I connected with Tina Mcgrath on 12/27/18 at  8:30 AM EDT by a video enabled telemedicine application and verified that I am speaking with the correct person using two identifiers.   I discussed the limitations of evaluation and management by telemedicine and the availability of in person appointments. The patient expressed understanding and agreed to proceed.  History of Present Illness: Pt is a 63 yo female with T2DM, HLD, migraines who calls in for 3 month refills.   DM- pt is doing well. She is not checking her sugars very often. She has lost another 3lbs. She is taking [place] and trulicity. Denies any hypoglycemia. No open sores or wounds.   Pt continues to have right hip pain and worsening. Any time she externally rotates her hips a lot of pain. Taking ibuprofen and tylenol as needed. No other injuries.   .. Active Ambulatory Problems    Diagnosis Date Noted  . Hyperlipidemia 08/19/2014  . Elevated vitamin B12 level 08/19/2014  . Hematuria 08/19/2014  . Primary osteoarthritis of left knee 08/19/2014  . Dry eyes 08/19/2014  . Migraine without aura and with status migrainosus, not intractable 08/19/2014  . Mild sleep apnea 08/19/2014  . Fecal incontinence 08/19/2014  . Elevated triglycerides with high cholesterol 09/03/2014  . Controlled diabetes mellitus type II without complication ([place]) 48/54/6270  . Proteinuria 09/10/2014  . Elevated blood pressure 11/13/2014  . Chronic cystitis 01/06/2015  . Vitamin D deficiency 08/27/2015  . History of cancer of right breast 11/04/2015  . Obesity 03/02/2016  . Cough 03/02/2016  . Carpal tunnel syndrome on both sides 08/23/2016  . Ulnar neuropathy of both upper extremities 08/23/2016  . Glaucoma suspect of both eyes 08/25/2016  . Elevated liver enzymes 11/28/2016  . Onychomycosis 11/28/2016  . Fatty liver disease, nonalcoholic  35/00/9381  . Hyperlipidemia associated with type 2 diabetes mellitus ([place]) 12/20/2017  . Osteoarthritis of right hip 03/23/2018  . Trouble in sleeping 06/28/2018  . Grief reaction 06/28/2018  . Hip flexor tendinitis, right 06/28/2018  . Statin intolerance 06/29/2018   Resolved Ambulatory Problems    Diagnosis Date Noted  . Impaired fasting glucose 08/19/2014  . Breast cancer ([place]) 11/13/2014  . Post-viral cough syndrome 11/04/2015  . Prediabetes 11/21/2015  . Right groin pain 03/23/2018   Past Medical History:  Diagnosis Date  . Cancer ([place])   . HSV-2 (herpes simplex virus 2) infection   . Migraines   . Osteoarthritis   . Rosacea   . Sleep apnea    Reviewed med, allergy, problem list.   Observations/Objective: No acute distress. Pain with external ROM of right hip.  .. Today's Vitals   12/27/18 0810  BP: 120/83  Pulse: 94  Temp: (!) 97.5 F (36.4 C)  Weight: 175 lb (79.4 kg)  Height: 5' 2.5" (1.588 m)   Body mass index is 31.5 kg/m.   Assessment and Plan: [name] KitchenMarland KitchenLynzee was seen today for diabetes.  Diagnoses and all orders for this visit:  Controlled type 2 diabetes mellitus without complication, without long-term current use of insulin (HCC) -     Dulaglutide (TRULICITY) 1.5 WE/9.9BZ SOPN; Inject 1 pen into the skin once a week. -     sitaGLIPtin (JANUVIA) 100 MG tablet; Take 1 tablet (100 mg total) by mouth daily.  Primary osteoarthritis of right hip  Right hip pain   ... Lab Results  Component  Value Date   HGBA1C 5.5 12/25/2018   A!C controlled.  Refilled medications.  Cannot tolerate statins.  On welchol.  BP great.  Follow up in 3 months.   Discussed right hip pain. managable with NSAIDs and tylenol as needed. Home exercises given for tendonitis made pain worse. I would consider follow up with ortho or sports medicine. This could be more OA of right hip and need other intervention.   Follow Up Instructions:    I discussed the assessment and  treatment plan with the patient. The patient was provided an opportunity to ask questions and all were answered. The patient agreed with the plan and demonstrated an understanding of the instructions.   The patient was advised to call back or seek an in-person evaluation if the symptoms worsen or if the condition fails to improve as anticipated.  I provided  25 minutes of non-face-to-face time during this encounter.   Iran Planas, PA-C

## 2018-12-27 NOTE — Progress Notes (Deleted)
Patient doing okay. She had not taken vital signs, she will take and give them to [facility] during visit.

## 2019-01-13 IMAGING — US US ABDOMEN LIMITED
1 series · 14 of 25 positions shown · non-contrast
Comparison: None.

CLINICAL DATA: Elevated liver enzymes.

EXAM:
US ABDOMEN LIMITED - RIGHT UPPER QUADRANT

[Series 1: us abdomen limited · 0.24mm/px · 14 of 39 slices shown]
[im 1/39]
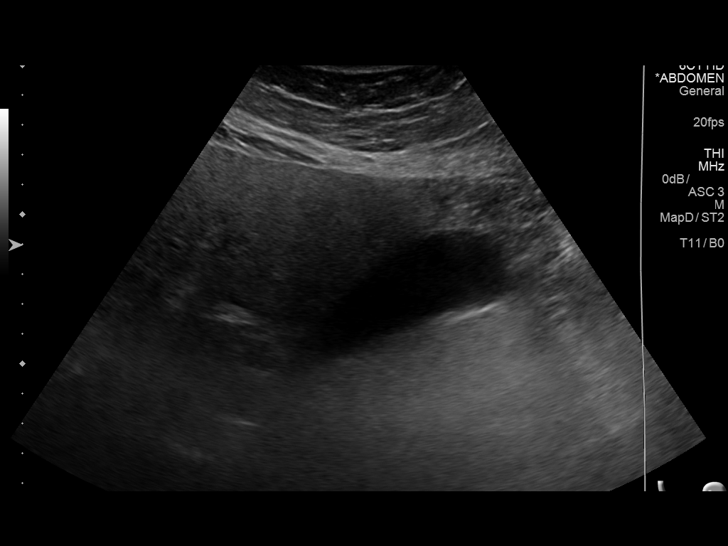
[im 4/39]
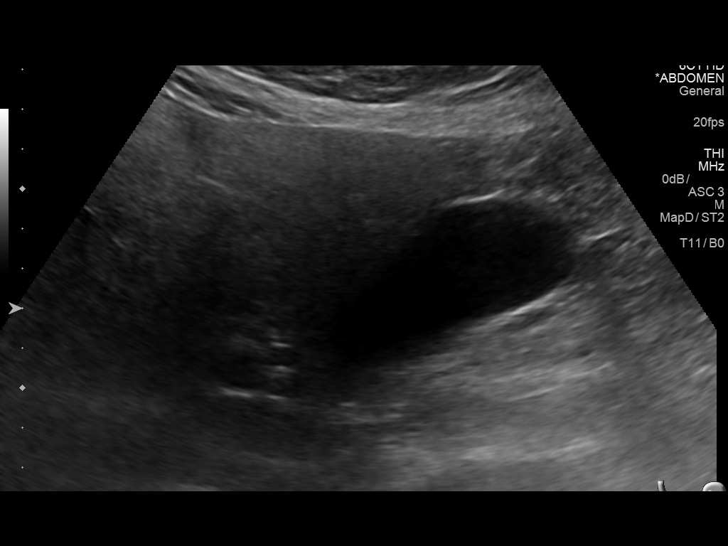
[im 7/39]
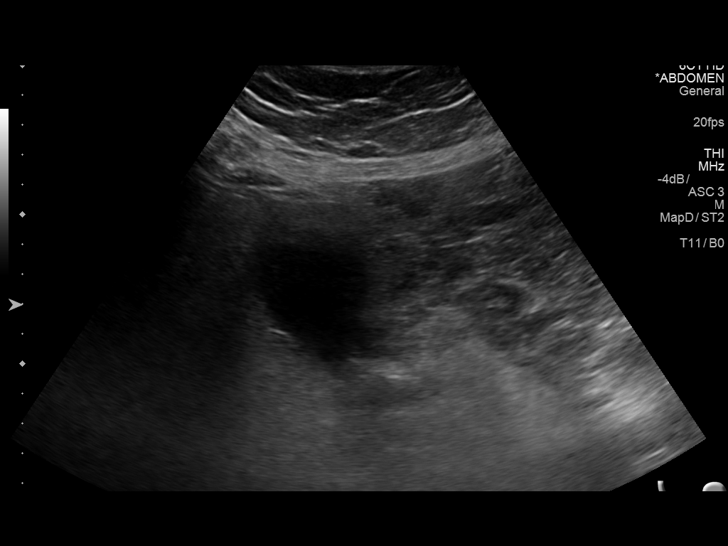
[im 10/39]
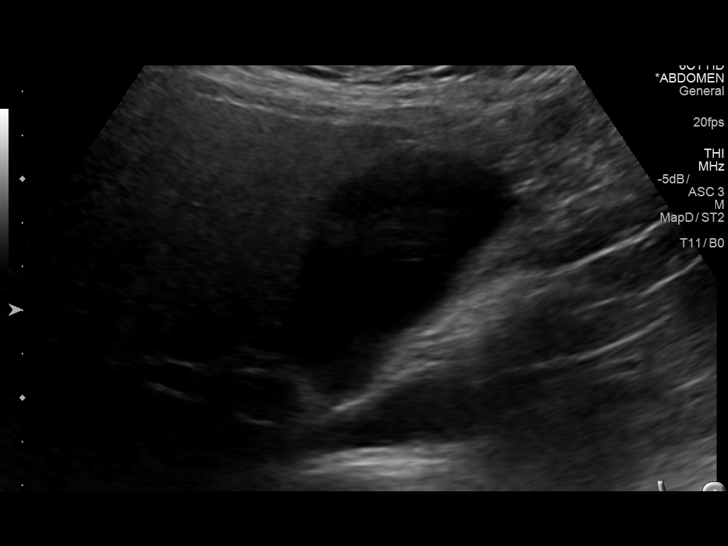
[im 13/39]
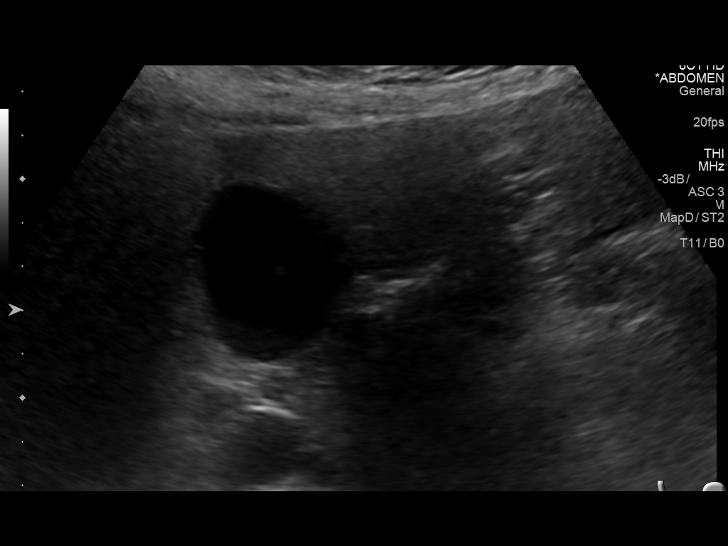
[im 15/39]
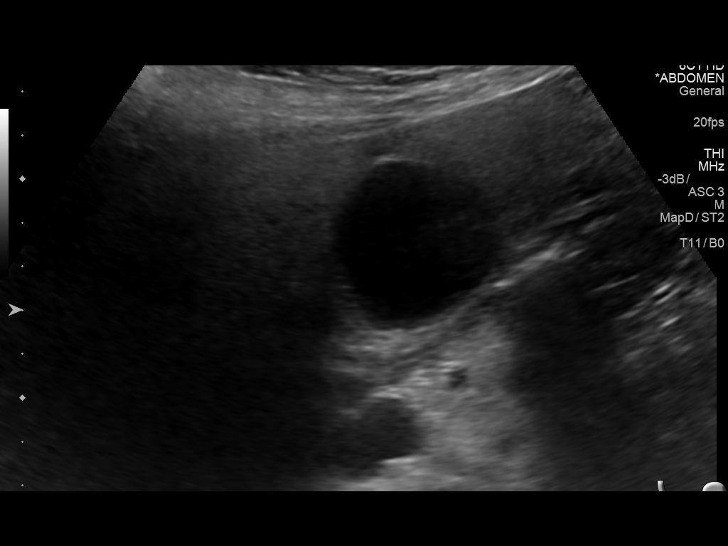
[im 18/39]
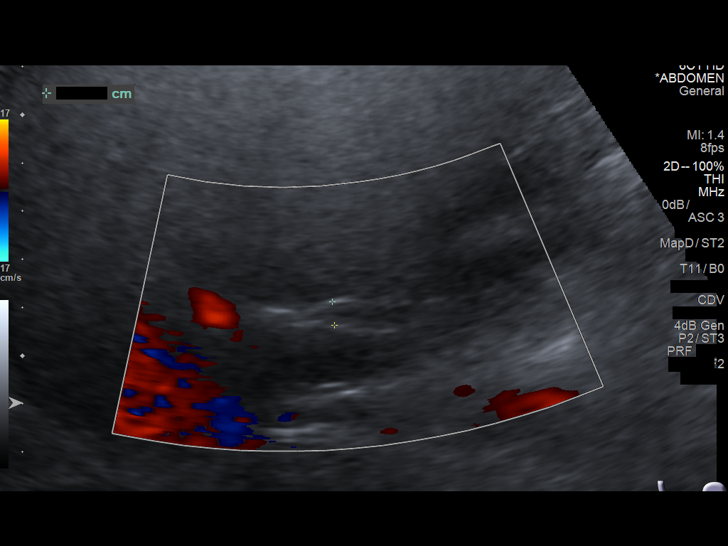
[im 21/39]
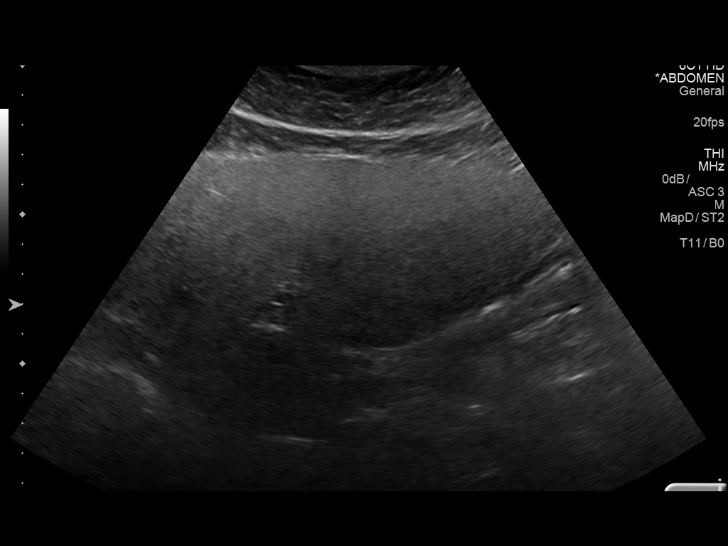
[im 24/39]
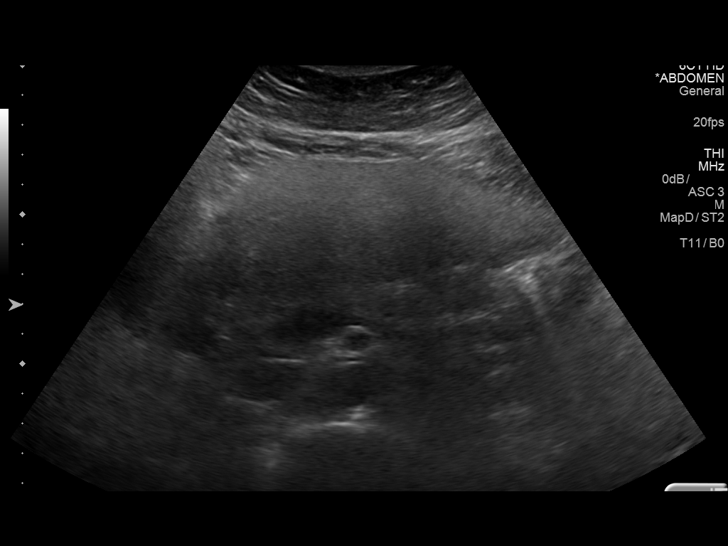
[im 26/39]
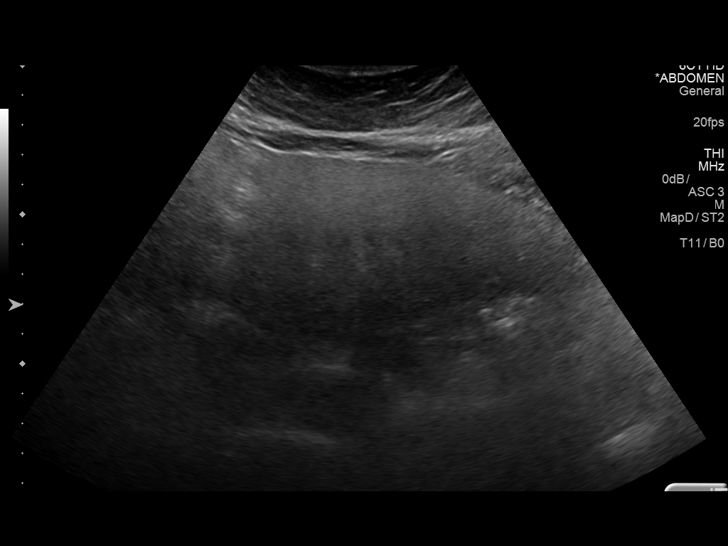
[im 29/39]
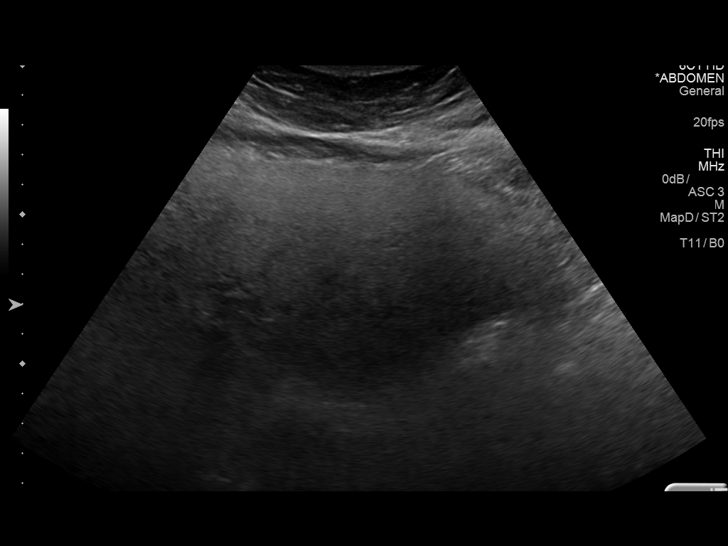
[im 32/39]
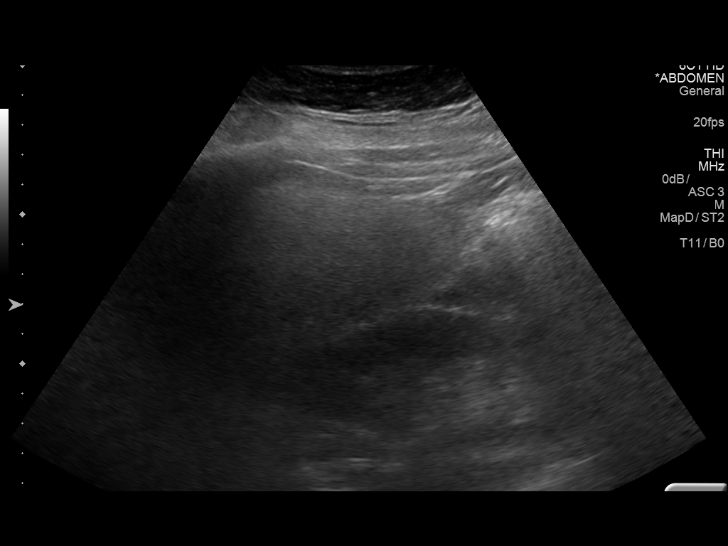
[im 35/39]
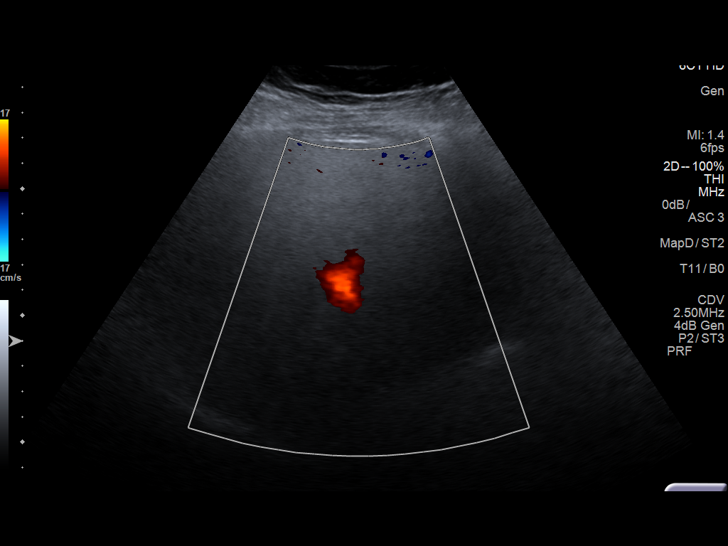
[im 39/39]
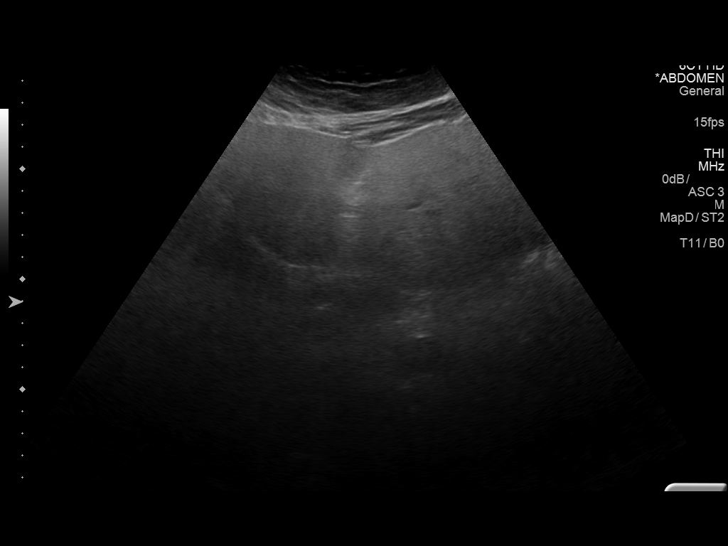

[14 of 25 positions shown; findings below may reference images not displayed]

FINDINGS: Gallbladder:

No gallstones or wall thickening visualized. No sonographic Murphy
sign noted by sonographer.

Common bile duct:

Diameter: 4.9 mm.

Liver:

Diffuse increased echogenicity consistent with fatty infiltration.
No focal mass lesion is noted.
IMPRESSION: Fatty infiltration of the liver.

## 2019-03-02 ENCOUNTER — Encounter: Payer: Self-pay | Admitting: Physician Assistant

## 2019-03-02 ENCOUNTER — Other Ambulatory Visit: Payer: Self-pay | Admitting: Physician Assistant

## 2019-03-02 DIAGNOSIS — E785 Hyperlipidemia, unspecified: Secondary | ICD-10-CM

## 2019-03-05 MED ORDER — COLESEVELAM HCL 625 MG PO TABS: 1875.0000 mg | ORAL_TABLET | Freq: Two times a day (BID) | ORAL | 1 refills | Status: DC

## 2019-03-18 ENCOUNTER — Other Ambulatory Visit: Payer: Self-pay | Admitting: Physician Assistant

## 2019-03-18 DIAGNOSIS — E119 Type 2 diabetes mellitus without complications: Secondary | ICD-10-CM

## 2019-03-26 ENCOUNTER — Ambulatory Visit: Payer: BC Managed Care – PPO | Admitting: Physician Assistant

## 2019-04-09 ENCOUNTER — Encounter: Payer: Self-pay | Admitting: Physician Assistant

## 2019-04-09 ENCOUNTER — Ambulatory Visit: Payer: BC Managed Care – PPO | Admitting: Physician Assistant

## 2019-04-09 ENCOUNTER — Other Ambulatory Visit: Payer: Self-pay

## 2019-04-09 VITALS — BP 134/87 | HR 107 | Ht 62.5 in | Wt 190.5 lb

## 2019-04-09 DIAGNOSIS — E785 Hyperlipidemia, unspecified: Secondary | ICD-10-CM

## 2019-04-09 DIAGNOSIS — E6609 Other obesity due to excess calories: Secondary | ICD-10-CM | POA: Diagnosis not present

## 2019-04-09 DIAGNOSIS — E1169 Type 2 diabetes mellitus with other specified complication: Secondary | ICD-10-CM

## 2019-04-09 DIAGNOSIS — K760 Fatty (change of) liver, not elsewhere classified: Secondary | ICD-10-CM

## 2019-04-09 DIAGNOSIS — E119 Type 2 diabetes mellitus without complications: Secondary | ICD-10-CM | POA: Diagnosis not present

## 2019-04-09 DIAGNOSIS — Z6834 Body mass index (BMI) 34.0-34.9, adult: Secondary | ICD-10-CM

## 2019-04-09 LAB — POCT GLYCOSYLATED HEMOGLOBIN (HGB A1C): Hemoglobin A1C: 5.5 % (ref 4.0–5.6)

## 2019-04-09 MED ORDER — TRULICITY 1.5 MG/0.5ML ~~LOC~~ SOAJ: SUBCUTANEOUS | 0 refills | Status: DC

## 2019-04-09 NOTE — Progress Notes (Signed)
Subjective:    Patient ID: Tina Mcgrath, female    DOB: 12-07-1955, 63 y.o.   MRN: [id number]  HPI  Pt is a 63 yo obese female with T2DM, HLD who presents to the clinic for 3 month follow up.   Pt is doing ok and compliant with medications. She is not checking her sugars. She is due for eye exam but not going due to [place] pandemic. She has gained weight 15lbs in 3 months. She blames it on being less active and her husband cooking more. No open sores or wounds. No vision changes. No hypoglycemic events.   .. Active Ambulatory Problems    Diagnosis Date Noted  . Hyperlipidemia 08/19/2014  . Elevated vitamin B12 level 08/19/2014  . Hematuria 08/19/2014  . Primary osteoarthritis of left knee 08/19/2014  . Dry eyes 08/19/2014  . Migraine without aura and with status migrainosus, not intractable 08/19/2014  . Mild sleep apnea 08/19/2014  . Fecal incontinence 08/19/2014  . Elevated triglycerides with high cholesterol 09/03/2014  . Controlled diabetes mellitus type II without complication ([place]) 61/21/3086  . Proteinuria 09/10/2014  . Elevated blood pressure 11/13/2014  . Chronic cystitis 01/06/2015  . Vitamin D deficiency 08/27/2015  . History of cancer of right breast 11/04/2015  . Class 1 obesity due to excess calories with serious comorbidity and body mass index (BMI) of 34.0 to 34.9 in adult 03/02/2016  . Cough 03/02/2016  . Carpal tunnel syndrome on both sides 08/23/2016  . Ulnar neuropathy of both upper extremities 08/23/2016  . Glaucoma suspect of both eyes 08/25/2016  . Elevated liver enzymes 11/28/2016  . Onychomycosis 11/28/2016  . Fatty liver disease, nonalcoholic 61/84/6962  . Hyperlipidemia associated with type 2 diabetes mellitus ([place]) 12/20/2017  . Osteoarthritis of right hip 03/23/2018  . Trouble in sleeping 06/28/2018  . Grief reaction 06/28/2018  . Hip flexor tendinitis, right 06/28/2018  . Statin intolerance 06/29/2018   Resolved Ambulatory Problems    Diagnosis  Date Noted  . Impaired fasting glucose 08/19/2014  . Breast cancer ([place]) 11/13/2014  . Post-viral cough syndrome 11/04/2015  . Prediabetes 11/21/2015  . Right groin pain 03/23/2018   Past Medical History:  Diagnosis Date  . Cancer ([place])   . HSV-2 (herpes simplex virus 2) infection   . Migraines   . Obesity 03/02/2016  . Osteoarthritis   . Rosacea   . Sleep apnea         Review of Systems  All other systems reviewed and are negative.      Objective:   Physical Exam Vitals signs reviewed.  Constitutional:      Appearance: Normal appearance.  HENT:     Head: Normocephalic.  Cardiovascular:     Rate and Rhythm: Normal rate and regular rhythm.  Pulmonary:     Effort: Pulmonary effort is normal.     Breath sounds: Normal breath sounds.  Neurological:     General: No focal deficit present.     Mental Status: She is alert and oriented to person, place, and time.  Psychiatric:        Mood and Affect: Mood normal.        Behavior: Behavior normal.           Assessment & Plan:  [name] KitchenMarland KitchenHaillee was seen today for diabetes.  Diagnoses and all orders for this visit:  Controlled type 2 diabetes mellitus without complication, without long-term current use of insulin (HCC) -     POCT glycosylated hemoglobin (Hb A1C) -  Dulaglutide (TRULICITY) 1.5 NM/0.7WK SOPN; INJECT 1 PEN INTO THE SKIN ONCE A WEEK  Fatty liver disease, nonalcoholic  Hyperlipidemia associated with type 2 diabetes mellitus (HCC)  Class 1 obesity due to excess calories with serious comorbidity and body mass index (BMI) of 34.0 to 34.9 in adult   .[name] Results for orders placed or performed in visit on 04/09/19  POCT glycosylated hemoglobin (Hb A1C)  Result Value Ref Range   Hemoglobin A1C 5.5 4.0 - 5.6 %   HbA1c POC (<> result, manual entry)     HbA1c, POC (prediabetic range)     HbA1c, POC (controlled diabetic range)      A!C great.  Labs up to date.  Stop januiva due to being on GLP-1.  Continue  to work on healthy lifestyle and exercise.  Cannot tolerate statins. On welchol.  On ACE.  Needs eye exam.  Follow up in 3 months.

## 2019-07-05 ENCOUNTER — Other Ambulatory Visit: Payer: Self-pay | Admitting: Physician Assistant

## 2019-07-05 DIAGNOSIS — E119 Type 2 diabetes mellitus without complications: Secondary | ICD-10-CM

## 2019-07-10 ENCOUNTER — Encounter: Payer: Self-pay | Admitting: Physician Assistant

## 2019-07-10 ENCOUNTER — Other Ambulatory Visit: Payer: Self-pay

## 2019-07-10 ENCOUNTER — Ambulatory Visit: Payer: BC Managed Care – PPO | Admitting: Physician Assistant

## 2019-07-10 VITALS — BP 140/74 | HR 106 | Ht 62.5 in | Wt 202.0 lb

## 2019-07-10 DIAGNOSIS — E119 Type 2 diabetes mellitus without complications: Secondary | ICD-10-CM | POA: Diagnosis not present

## 2019-07-10 DIAGNOSIS — Z6836 Body mass index (BMI) 36.0-36.9, adult: Secondary | ICD-10-CM

## 2019-07-10 DIAGNOSIS — E785 Hyperlipidemia, unspecified: Secondary | ICD-10-CM | POA: Diagnosis not present

## 2019-07-10 DIAGNOSIS — G43001 Migraine without aura, not intractable, with status migrainosus: Secondary | ICD-10-CM | POA: Diagnosis not present

## 2019-07-10 DIAGNOSIS — M1611 Unilateral primary osteoarthritis, right hip: Secondary | ICD-10-CM

## 2019-07-10 LAB — POCT GLYCOSYLATED HEMOGLOBIN (HGB A1C): Hemoglobin A1C: 5.6 % (ref 4.0–5.6)

## 2019-07-10 MED ORDER — TRULICITY 1.5 MG/0.5ML ~~LOC~~ SOAJ: SUBCUTANEOUS | 1 refills | Status: DC

## 2019-07-10 MED ORDER — SUMATRIPTAN SUCCINATE 100 MG PO TABS: 100.0000 mg | ORAL_TABLET | ORAL | 2 refills | Status: AC | PRN

## 2019-07-10 MED ORDER — COLESEVELAM HCL 625 MG PO TABS: 1875.0000 mg | ORAL_TABLET | Freq: Two times a day (BID) | ORAL | 2 refills | Status: DC

## 2019-07-10 NOTE — Progress Notes (Signed)
Subjective:    Patient ID: Tina Mcgrath, female    DOB: [birth year], 63 y.o.   MRN: [id number]  HPI Pt is a 63 yo female with T2DM, HLD who presents to the clinic for 3 month follow up.  Pt is doing ok and compliant with medications. She is not checking her sugars. She is due for eye exam but waiting until COVID pandemic is over. She has gained 12 more lbs but stated she has lost 5lbs in the last week. She admits to being less active due to her right hip pain and the anniversary of her sons dealth. No open sores or wounds. No vision changes. No hypoglycemic events.   Migraines controlled. 1-2 a month rescued with imitrex.   .. Active Ambulatory Problems    Diagnosis Date Noted  . Hyperlipidemia 08/19/2014  . Elevated vitamin B12 level 08/19/2014  . Hematuria 08/19/2014  . Primary osteoarthritis of left knee 08/19/2014  . Dry eyes 08/19/2014  . Migraine without aura and with status migrainosus, not intractable 08/19/2014  . Mild sleep apnea 08/19/2014  . Fecal incontinence 08/19/2014  . Elevated triglycerides with high cholesterol 09/03/2014  . Controlled diabetes mellitus type II without complication ([place]) 123456  . Proteinuria 09/10/2014  . Elevated blood pressure 11/13/2014  . Chronic cystitis 01/06/2015  . Vitamin D deficiency 08/27/2015  . History of cancer of right breast 11/04/2015  . Class 2 severe obesity due to excess calories with serious comorbidity and body mass index (BMI) of 36.0 to 36.9 in adult ([place]) 03/02/2016  . Cough 03/02/2016  . Carpal tunnel syndrome on both sides 08/23/2016  . Ulnar neuropathy of both upper extremities 08/23/2016  . Glaucoma suspect of both eyes 08/25/2016  . Elevated liver enzymes 11/28/2016  . Onychomycosis 11/28/2016  . Fatty liver disease, nonalcoholic [id number]  . Hyperlipidemia associated with type 2 diabetes mellitus ([place]) 12/20/2017  . Osteoarthritis of right hip 03/23/2018  . Trouble in sleeping 06/28/2018  . Grief reaction  06/28/2018  . Hip flexor tendinitis, right 06/28/2018  . Statin intolerance 06/29/2018   Resolved Ambulatory Problems    Diagnosis Date Noted  . Impaired fasting glucose 08/19/2014  . Breast cancer ([place]) 11/13/2014  . Post-viral cough syndrome 11/04/2015  . Prediabetes 11/21/2015  . Right groin pain 03/23/2018   Past Medical History:  Diagnosis Date  . Cancer ([place])   . HSV-2 (herpes simplex virus 2) infection   . Migraines   . Obesity 03/02/2016  . Osteoarthritis   . Rosacea   . Sleep apnea       Review of Systems  All other systems reviewed and are negative.      Objective:   Physical Exam Vitals signs reviewed.  Constitutional:      Appearance: Normal appearance. She is obese.  HENT:     Head: Normocephalic.  Cardiovascular:     Rate and Rhythm: Normal rate and regular rhythm.     Pulses: Normal pulses.  Pulmonary:     Effort: Pulmonary effort is normal.     Breath sounds: Normal breath sounds.  Neurological:     General: No focal deficit present.     Mental Status: She is alert and oriented to person, place, and time.  Psychiatric:        Mood and Affect: Mood normal.           Assessment & Plan:   [name] KitchenMarland KitchenDellar was seen today for diabetes.  Diagnoses and all orders for this visit:  Controlled type 2  diabetes mellitus without complication, without long-term current use of insulin (HCC) -     POCT glycosylated hemoglobin (Hb A1C) -     Dulaglutide (TRULICITY) 1.5 0000000 SOPN; INJECT 1 PEN INTO THE SKIN ONCE WEEKLY  Hyperlipidemia, unspecified hyperlipidemia type -     colesevelam (WELCHOL) 625 MG tablet; Take 3 tablets (1,875 mg total) by mouth 2 (two) times daily with a meal.  Class 2 severe obesity due to excess calories with serious comorbidity and body mass index (BMI) of 36.0 to 36.9 in adult [facility])  Migraine without aura and with status migrainosus, not intractable -     SUMAtriptan (IMITREX) 100 MG tablet; Take 1 tablet (100 mg total) by mouth  as needed for migraine or headache. May repeat in 2 hours if headache persists or recurs.  Primary osteoarthritis of right hip    Lab Results  Component Value Date   HGBA1C 5.6 07/10/2019   A!C great.  Continue on same medications.  BP not to goal. Pt reports readings under 130/80 at home.  Pt does not tolerate statin. On welchol.  Pt declines flu and pneumonia vaccine.  Needs eye exam.   .. Diabetic Foot Exam - Simple   Simple Foot Form Diabetic Foot exam was performed with the following findings: Yes 07/10/2019  9:20 AM  Visual Inspection No deformities, no ulcerations, no other skin breakdown bilaterally: Yes Sensation Testing Intact to touch and monofilament testing bilaterally: Yes Pulse Check Posterior Tibialis and Dorsalis pulse intact bilaterally: Yes Comments    Discussed weight. I think right hip pain is effecting her activity and weight. Pt agrees to make appt with ortho.  [name].Discussed low carb diet with 1500 calories and 80g of protein.  Exercising at least 150 minutes a week.  [employer]itness Pal could be a Microbiologist.

## 2019-08-30 ENCOUNTER — Other Ambulatory Visit: Payer: Self-pay | Admitting: Physician Assistant

## 2019-08-30 DIAGNOSIS — E785 Hyperlipidemia, unspecified: Secondary | ICD-10-CM

## 2019-09-30 ENCOUNTER — Other Ambulatory Visit: Payer: Self-pay | Admitting: Physician Assistant

## 2019-09-30 DIAGNOSIS — E119 Type 2 diabetes mellitus without complications: Secondary | ICD-10-CM

## 2019-10-15 ENCOUNTER — Ambulatory Visit: Payer: BC Managed Care – PPO | Admitting: Physician Assistant

## 2019-10-18 HISTORY — PX: CATARACT EXTRACTION: SUR2

## 2019-11-01 ENCOUNTER — Telehealth: Payer: Self-pay | Admitting: Physician Assistant

## 2019-11-01 DIAGNOSIS — E119 Type 2 diabetes mellitus without complications: Secondary | ICD-10-CM

## 2019-11-01 DIAGNOSIS — E1169 Type 2 diabetes mellitus with other specified complication: Secondary | ICD-10-CM

## 2019-11-01 NOTE — Telephone Encounter (Signed)
CMP/Lipid panel/A1C pended. Please add any labs if needed.

## 2019-11-01 NOTE — Telephone Encounter (Signed)
PT requested bloodwork to be sent [facility].

## 2019-11-01 NOTE — Addendum Note (Signed)
Addended byLoma Boston, [name] L on: 11/01/2019 11:00 AM   Modules accepted: Orders

## 2019-11-02 NOTE — Addendum Note (Signed)
Addended byIran Planas L on: 11/02/2019 06:00 AM   Modules accepted: Orders

## 2019-11-08 LAB — COMPLETE METABOLIC PANEL WITH GFR
AG Ratio: 1.8 (calc) (ref 1.0–2.5)
ALT: 33 U/L — ABNORMAL HIGH (ref 6–29)
AST: 20 U/L (ref 10–35)
Albumin: 4.1 g/dL (ref 3.6–5.1)
Alkaline phosphatase (APISO): 65 U/L (ref 37–153)
BUN: 11 mg/dL (ref 7–25)
CO2: 27 mmol/L (ref 20–32)
Calcium: 9.1 mg/dL (ref 8.6–10.4)
Chloride: 103 mmol/L (ref 98–110)
Creat: 0.57 mg/dL (ref 0.50–0.99)
GFR, Est African American: 116 mL/min/{1.73_m2} (ref >=60)
GFR, Est Non African American: 100 mL/min/{1.73_m2} (ref >=60)
Globulin: 2.3 g/dL (calc) (ref 1.9–3.7)
Glucose, Bld: 118 mg/dL — ABNORMAL HIGH (ref 65–99)
Potassium: 4.6 mmol/L (ref 3.5–5.3)
Sodium: 139 mmol/L (ref 135–146)
Total Bilirubin: 0.4 mg/dL (ref 0.2–1.2)
Total Protein: 6.4 g/dL (ref 6.1–8.1)

## 2019-11-08 LAB — LIPID PANEL
Cholesterol: 234 mg/dL — ABNORMAL HIGH (ref <200)
HDL: 49 mg/dL — ABNORMAL LOW (ref >=50)
LDL Cholesterol (Calc): 151 mg/dL (calc) — ABNORMAL HIGH
Non-HDL Cholesterol (Calc): 185 mg/dL (calc) — ABNORMAL HIGH (ref <130)
Total CHOL/HDL Ratio: 4.8 (calc) (ref <5.0)
Triglycerides: 195 mg/dL — ABNORMAL HIGH (ref <150)

## 2019-11-08 LAB — HEMOGLOBIN A1C
Hgb A1c MFr Bld: 6.2 % of total Hgb — ABNORMAL HIGH (ref <5.7)
Mean Plasma Glucose: 131 (calc)
eAG (mmol/L): 7.3 (calc)

## 2019-11-09 NOTE — Telephone Encounter (Signed)
Tina Mcgrath,   We can talk at upcoming appt more but A1C is elevated at 6.2 from 5.6 at last visit.  LDL has increased quite a bit.  TG are elevated.  One liver enzyme is elevated.   [name]

## 2019-11-14 ENCOUNTER — Other Ambulatory Visit: Payer: Self-pay

## 2019-11-14 ENCOUNTER — Ambulatory Visit: Payer: BC Managed Care – PPO | Admitting: Physician Assistant

## 2019-11-14 ENCOUNTER — Encounter: Payer: Self-pay | Admitting: Physician Assistant

## 2019-11-14 VITALS — BP 140/62 | HR 102 | Ht 62.5 in | Wt 218.0 lb

## 2019-11-14 DIAGNOSIS — E785 Hyperlipidemia, unspecified: Secondary | ICD-10-CM

## 2019-11-14 DIAGNOSIS — F4329 Adjustment disorder with other symptoms: Secondary | ICD-10-CM | POA: Diagnosis not present

## 2019-11-14 DIAGNOSIS — K219 Gastro-esophageal reflux disease without esophagitis: Secondary | ICD-10-CM | POA: Diagnosis not present

## 2019-11-14 DIAGNOSIS — F4381 Prolonged grief disorder: Secondary | ICD-10-CM

## 2019-11-14 DIAGNOSIS — E119 Type 2 diabetes mellitus without complications: Secondary | ICD-10-CM | POA: Diagnosis not present

## 2019-11-14 LAB — POCT UA - MICROALBUMIN
Albumin/Creatinine Ratio, Urine, POC: >300
Creatinine, POC: 50 mg/dL
Microalbumin Ur, POC: 80 mg/L

## 2019-11-14 MED ORDER — DEXILANT 60 MG PO CPDR: 60.0000 mg | DELAYED_RELEASE_CAPSULE | Freq: Every day | ORAL | 3 refills | Status: DC

## 2019-11-14 MED ORDER — TRULICITY 1.5 MG/0.5ML ~~LOC~~ SOAJ: SUBCUTANEOUS | 0 refills | Status: DC

## 2019-11-14 MED ORDER — COLESEVELAM HCL 625 MG PO TABS: 1875.0000 mg | ORAL_TABLET | Freq: Two times a day (BID) | ORAL | 2 refills | Status: DC

## 2019-11-14 MED ORDER — EZETIMIBE 10 MG PO TABS: 10.0000 mg | ORAL_TABLET | Freq: Every day | ORAL | 3 refills | Status: DC

## 2019-11-14 NOTE — Progress Notes (Signed)
Subjective:    Patient ID: Tina Mcgrath, female    DOB: Dec 30, 1955, 64 y.o.   MRN: [id number]  HPI  Pt is a 64 yo obese female with T2DM, HLD, migraines, GERD who presents to the clinic for follow up.   Her a1c was just checked and 6.2 up from 3 months ago. She admits to "not caring". She has had a hard 6 months with grief from her sons death 2 years ago. She has no pleasure in doing things. She is eating terrible and not exercise due to right hip pain from OA and saddness. No SI/HC. She is not checking her sugars. She continues taking trulicity.   Her GERD is not controlled with OTC pepcid. pepcid helps for about 1 hour. She used dexilant in the past and worked well.   Her cholesterol was elevated on labs as well.   .. Active Ambulatory Problems    Diagnosis Date Noted  . Hyperlipidemia 08/19/2014  . Elevated vitamin B12 level 08/19/2014  . Hematuria 08/19/2014  . Primary osteoarthritis of left knee 08/19/2014  . Dry eyes 08/19/2014  . Migraine without aura and with status migrainosus, not intractable 08/19/2014  . Mild sleep apnea 08/19/2014  . Fecal incontinence 08/19/2014  . Elevated triglycerides with high cholesterol 09/03/2014  . Controlled diabetes mellitus type II without complication ([place]) 123456  . Proteinuria 09/10/2014  . Elevated blood pressure 11/13/2014  . Chronic cystitis 01/06/2015  . Vitamin D deficiency 08/27/2015  . History of cancer of right breast 11/04/2015  . Class 2 severe obesity due to excess calories with serious comorbidity and body mass index (BMI) of 36.0 to 36.9 in adult ([place]) 03/02/2016  . Cough 03/02/2016  . Carpal tunnel syndrome on both sides 08/23/2016  . Ulnar neuropathy of both upper extremities 08/23/2016  . Glaucoma suspect of both eyes 08/25/2016  . Elevated liver enzymes 11/28/2016  . Onychomycosis 11/28/2016  . Fatty liver disease, nonalcoholic [id number]  . Hyperlipidemia associated with type 2 diabetes mellitus ([place]) 12/20/2017   . Osteoarthritis of right hip 03/23/2018  . Trouble in sleeping 06/28/2018  . Grief reaction 06/28/2018  . Hip flexor tendinitis, right 06/28/2018  . Statin intolerance 06/29/2018  . Gastroesophageal reflux disease 11/14/2019   Resolved Ambulatory Problems    Diagnosis Date Noted  . Impaired fasting glucose 08/19/2014  . Breast cancer ([place]) 11/13/2014  . Post-viral cough syndrome 11/04/2015  . Prediabetes 11/21/2015  . Right groin pain 03/23/2018   Past Medical History:  Diagnosis Date  . Cancer ([place])   . HSV-2 (herpes simplex virus 2) infection   . Migraines   . Obesity 03/02/2016  . Osteoarthritis   . Rosacea   . Sleep apnea       Review of Systems See HPI.     Objective:   Physical Exam Vitals reviewed.  Constitutional:      Appearance: Normal appearance. She is obese.  Neck:     Vascular: No carotid bruit.  Cardiovascular:     Rate and Rhythm: Normal rate and regular rhythm.  Pulmonary:     Effort: Pulmonary effort is normal.     Breath sounds: Normal breath sounds.  Lymphadenopathy:     Cervical: No cervical adenopathy.  Neurological:     General: No focal deficit present.     Mental Status: She is alert and oriented to person, place, and time.  Psychiatric:     Comments: Flat affect.            Assessment &  Plan:  ..Tina Mcgrath was seen today for diabetes.  Diagnoses and all orders for this visit:  Controlled type 2 diabetes mellitus without complication, without long-term current use of insulin (HCC) -     POCT UA - Microalbumin -     Dulaglutide (TRULICITY) 1.5 0000000 SOPN; 1.5mg  weekly into skin.  Hyperlipidemia, unspecified hyperlipidemia type -     colesevelam (WELCHOL) 625 MG tablet; Take 3 tablets (1,875 mg total) by mouth 2 (two) times daily with a meal. -     ezetimibe (ZETIA) 10 MG tablet; Take 1 tablet (10 mg total) by mouth daily.  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  Other orders -     Discontinue: dexlansoprazole (DEXILANT) 60 MG capsule; Take 1 capsule (60 mg total) by mouth daily.   Lab Results  Component Value Date   HGBA1C 6.2 (H) 11/07/2019     Discussion about cholesterol and CV risk. Tried numerous statins and has side effects. Discussed repatha but declined due to injections. Will try zetia with welchol.  A1C is up from last but still under 6.5.  Continue trulicity.  Eye exam UTD.  Declined pnemonia vaccine.  Follow up in 3 months.   Added dexilant for GERD to pepcid.   Discussed mood. Consider counseling. Pt will consider. Declines medication. Discussed other ways to work through and process grief.  Follow up as needed or in 3 months.

## 2019-11-14 NOTE — Patient Instructions (Signed)
Complicated Grief Grief is a normal response to the death of someone close to you. Feelings of fear, anger, and guilt can affect almost everyone who loses a loved one. It is also common to have symptoms of depression while you are grieving. These include problems with sleep, loss of appetite, and lack of energy. They may last for weeks or months after a loss. Complicated grief is different from normal grief or depression. Normal grieving involves sadness and feelings of loss, but those feelings get better and heal over time. Complicated grief is a severe type of grief that lasts for a long time, usually for several months to a year or longer. It interferes with your ability to function normally. Complicated grief may require treatment from a mental health care provider. What are the causes? The cause of this condition is not known. It is not clear why some people continue to struggle with grief and others do not. What increases the risk? You are more likely to develop this condition if:  The death of your loved one was sudden or unexpected.  The death of your loved one was due to a violent event.  Your loved one died from suicide.  Your loved one was a child or a young person.  You were very close to your loved one, or you were dependent on him or her.  You have a history of depression or anxiety. What are the signs or symptoms? Symptoms of this condition include:  Feeling disbelief or having a lack of emotion (numbness).  Being unable to enjoy good memories of your loved one.  Needing to avoid anything or anyone that reminds you of your loved one.  Being unable to stop thinking about the death.  Feeling intense anger or guilt.  Feeling alone and hopeless.  Feeling that your life is meaningless and empty.  Losing the desire to move on with your life. How is this diagnosed? This condition may be diagnosed based on:  Your symptoms. Complicated grief will be  diagnosed if you have ongoing symptoms of grief for 6-12 months or longer.  The effect of symptoms on your life. You may be diagnosed with this condition if your symptoms are interfering with your ability to live your life. Your health care provider may recommend that you see a mental health care provider. Many symptoms of depression are similar to the symptoms of complicated grief. It is important to be evaluated for complicated grief along with other mental health conditions. How is this treated? This condition is most commonly treated with talk therapy. This therapy is offered by a mental health specialist (psychiatrist). During therapy:  You will learn healthy ways to cope with the loss of your loved one.  Your mental health care provider may recommend antidepressant medicines. Follow these instructions at home: Lifestyle   Take care of yourself. ? Eat on a regular basis, and maintain a healthy diet. Eat plenty of fruits, vegetables, lean protein, and whole grains. ? Try to get some exercise each day. Aim for 30 minutes of exercise on most days of the week. ? Keep a consistent sleep schedule. Try to get 8 or more hours of sleep each night. ? Start doing the things that you used to enjoy.  Do not use drugs or alcohol to ease your symptoms.  Spend time with friends and loved ones. General instructions  Take over-the-counter and prescription medicines only as told by your health care provider.  Consider  joining a grief (bereavement) support group to help you deal with your loss.  Keep all follow-up visits as told by your health care provider. This is important. Contact a health care provider if:  Your symptoms prevent you from functioning normally.  Your symptoms do not get better with treatment. Get help right away if:  You have serious thoughts about hurting yourself or someone else.  You have suicidal feelings. If you ever feel like you may hurt yourself or others, or have  thoughts about taking your own life, get help right away. You can go to your nearest emergency department or call:  Your local emergency services (911 in the [place]  A suicide crisis helpline, such as the [facility] at (681)131-5993. This is open 24 hours a day. Summary  Complicated grief is a severe type of grief that lasts for a long time. This grief is not likely to go away on its own. Get the help you need.  Some griefs are more difficult than others and can cause this condition. You may need a certain type of treatment to help you recover if the loss of your loved one was sudden, violent, or due to suicide.  You may feel guilty about moving on with your life. Getting help does not mean that you are forgetting your loved one. It means that you are taking care of yourself.  Complicated grief is best treated with talk therapy. Medicines may also be prescribed.  Seek the help you need, and find support that will help you recover. This information is not intended to replace advice given to you by your health care provider. Make sure you discuss any questions you have with your health care provider. Document Revised: 08/05/2017 Document Reviewed: 06/08/2017 Elsevier Patient Education  [place]. To read about injection for cholesterol if you wanted to do it.     Evolocumab injection  What is this medicine? EVOLOCUMAB (e voe LOK ue mab) is known as a PCSK9 inhibitor. It is used to lower the level of cholesterol in the blood. It may be used alone or in combination with other cholesterol-lowering drugs. This drug may also be used to reduce the risk of heart attack, stroke, and certain types of heart surgery in patients with heart disease. This medicine may be used for other purposes; ask your health care provider or pharmacist if you have questions. COMMON BRAND NAME(S): Repatha What should I tell my health care provider before I take this medicine? They  need to know if you have any of these conditions:  an unusual or allergic reaction to evolocumab, other medicines, latex, foods, dyes, or preservatives  pregnant or trying to get pregnant  breast-feeding How should I use this medicine? This medicine is for injection under the skin. You will be taught how to prepare and give this medicine. Use exactly as directed. Take your medicine at regular intervals. Do not take your medicine more often than directed. It is important that you put your used needles and syringes in a special sharps container. Do not put them in a trash can. If you do not have a sharps container, call your pharmacist or health care provider to get one. Talk to your pediatrician regarding the use of this medicine in children. While this drug may be prescribed for children as young as 13 years for selected conditions, precautions do apply. Overdosage: If you think you have taken too much of this medicine contact a poison control center or  emergency room at once. NOTE: This medicine is only for you. Do not share this medicine with others. What if I miss a dose? If you miss a dose, take it as soon as you can if there are more than 7 days until the next scheduled dose, or skip the missed dose and take the next dose according to your original schedule. Do not take double or extra doses. What may interact with this medicine? Interactions are not expected. This list may not describe all possible interactions. Give your health care provider a list of all the medicines, herbs, non-prescription drugs, or dietary supplements you use. Also tell them if you smoke, drink alcohol, or use illegal drugs. Some items may interact with your medicine. What should I watch for while using this medicine? Visit your health care provider for regular checks on your progress. Tell your health care provider if your symptoms do not start to get better or if they get worse. You may need blood work done while  you are taking this drug. Do not wear the on-body infuser during an MRI. What side effects may I notice from receiving this medicine? Side effects that you should report to your doctor or health care professional as soon as possible:  allergic reactions like skin rash, itching or hives, swelling of the face, lips, or tongue  signs and symptoms of high blood sugar such as dizziness; dry mouth; dry skin; fruity breath; nausea; stomach pain; increased hunger or thirst; increased urination  signs and symptoms of infection like fever or chills; cough; sore throat; pain or trouble passing urine Side effects that usually do not require medical attention (report to your doctor or health care professional if they continue or are bothersome):  diarrhea  nausea  muscle pain  pain, redness, or irritation at site where injected This list may not describe all possible side effects. Call your doctor for medical advice about side effects. You may report side effects to FDA at 1-800-FDA-1088. Where should I keep my medicine? Keep out of the reach of children. You will be instructed on how to store this medicine. Throw away any unused medicine after the expiration date on the label. NOTE: This sheet is a summary. It may not cover all possible information. If you have questions about this medicine, talk to your doctor, pharmacist, or health care provider.  2020 Elsevier/Gold Standard (2019-06-26 16:22:29)

## 2019-11-19 ENCOUNTER — Telehealth: Payer: Self-pay | Admitting: Physician Assistant

## 2019-11-19 ENCOUNTER — Encounter: Payer: Self-pay | Admitting: Physician Assistant

## 2019-11-19 NOTE — Telephone Encounter (Signed)
Received fax for [place] sent through cover my meds waiting on determination. - CF

## 2019-11-27 ENCOUNTER — Other Ambulatory Visit: Payer: Self-pay | Admitting: Physician Assistant

## 2019-11-29 NOTE — Telephone Encounter (Signed)
Received fax from [place] and they denied coverage on Dexilant due to patient did not meet requirements of plan. Placing in providers box for review. - CF

## 2019-11-30 ENCOUNTER — Encounter: Payer: Self-pay | Admitting: Physician Assistant

## 2019-12-05 LAB — HM COLONOSCOPY

## 2019-12-11 ENCOUNTER — Telehealth: Payer: Self-pay | Admitting: Neurology

## 2019-12-11 DIAGNOSIS — R05 Cough: Secondary | ICD-10-CM

## 2019-12-11 DIAGNOSIS — Z800 Family history of malignant neoplasm of digestive organs: Secondary | ICD-10-CM

## 2019-12-11 DIAGNOSIS — K219 Gastro-esophageal reflux disease without esophagitis: Secondary | ICD-10-CM

## 2019-12-11 DIAGNOSIS — R059 Cough, unspecified: Secondary | ICD-10-CM

## 2019-12-11 NOTE — Telephone Encounter (Signed)
See note from patient

## 2019-12-11 NOTE — Telephone Encounter (Signed)
Dexilant denied by insurance. Tried to call patient to see what alternative medications she has tried in the past. No answer. Mychart message sent to patient.

## 2019-12-12 DIAGNOSIS — Z800 Family history of malignant neoplasm of digestive organs: Secondary | ICD-10-CM | POA: Insufficient documentation

## 2019-12-12 NOTE — Telephone Encounter (Signed)
Can you look into welchol. She cannot tolerate statins and she needs cholesterol lowering meds like welchol. She is already on zetia and she is not at goal.

## 2019-12-12 NOTE — Addendum Note (Signed)
Addended by: Donella Stade on: 12/12/2019 04:10 PM   Modules accepted: Orders

## 2019-12-24 ENCOUNTER — Encounter: Payer: Self-pay | Admitting: Physician Assistant

## 2020-01-07 ENCOUNTER — Encounter: Payer: Self-pay | Admitting: Physician Assistant

## 2020-01-07 LAB — BASIC METABOLIC PANEL
BUN: 8 (ref 4–21)
CO2: 26 — AB (ref 13–22)
Chloride: 104 (ref 99–108)
Creatinine: 0.6 (ref 0.5–1.1)
Glucose: 134
Potassium: 4 (ref 3.4–5.3)
Sodium: 139 (ref 137–147)

## 2020-01-07 LAB — CBC AND DIFFERENTIAL
HCT: 44 (ref 36–46)
Hemoglobin: 14.7 (ref 12.0–16.0)
Neutrophils Absolute: 2460
Platelets: 271 (ref 150–399)
WBC: 4.4

## 2020-01-07 LAB — COMPREHENSIVE METABOLIC PANEL
Albumin: 4.3 (ref 3.5–5.0)
Calcium: 9.5 (ref 8.7–10.7)
GFR calc Af Amer: 115
GFR calc non Af Amer: 99
Globulin: 2.6

## 2020-01-07 LAB — HEPATIC FUNCTION PANEL
ALT: 30 (ref 7–35)
AST: 24 (ref 13–35)
Alkaline Phosphatase: 58 (ref 25–125)
Bilirubin, Total: 0.6

## 2020-01-07 LAB — CBC: RBC: 4.96 (ref 3.87–5.11)

## 2020-01-09 ENCOUNTER — Other Ambulatory Visit: Payer: Self-pay | Admitting: Nurse Practitioner

## 2020-01-09 ENCOUNTER — Telehealth: Payer: Self-pay | Admitting: Nurse Practitioner

## 2020-01-09 DIAGNOSIS — Z01818 Encounter for other preprocedural examination: Secondary | ICD-10-CM

## 2020-01-09 NOTE — Telephone Encounter (Signed)
Received paperwork for the patients pre-op clearance. The paperwork specifies that the patient must have labs (CBC, BMP, Albumin, PT/INR, hb A1c, ad UA) performed within 6 weeks of the date of the approval. She has not had labs within 6 weeks, so we will need to have these done and results reviewed before that can be sent.   Additionally, the report states that the patient must have a physical exam performed and necessary testing (EKG, CXR if required) performed prior to approval.   Please let the patient know and ask her to schedule an appointment for tomorrow or [facility] Friday for her physical. I have pended the labs and she can go have those drawn tonight or tomorrow. I am afraid Friday will be too late to get results back in time.   The paperwork states that it is due by Friday.  Thank you SaraBeth

## 2020-01-09 NOTE — Telephone Encounter (Signed)
Patient made aware. She refused appt for Thursday/Friday (can only come Friday after 3 pm). She made appt for Monday. She states she had labs at [facility]. Will get for appt.

## 2020-01-14 ENCOUNTER — Encounter: Payer: Self-pay | Admitting: Nurse Practitioner

## 2020-01-14 ENCOUNTER — Other Ambulatory Visit: Payer: Self-pay

## 2020-01-14 ENCOUNTER — Ambulatory Visit (INDEPENDENT_AMBULATORY_CARE_PROVIDER_SITE_OTHER): Payer: BC Managed Care – PPO | Admitting: Nurse Practitioner

## 2020-01-14 VITALS — BP 144/72 | HR 113 | Ht 62.5 in | Wt 217.3 lb

## 2020-01-14 DIAGNOSIS — Z01818 Encounter for other preprocedural examination: Secondary | ICD-10-CM

## 2020-01-14 NOTE — Progress Notes (Signed)
Established Patient Office Visit  Subjective:  Patient ID: Tina Mcgrath, female    DOB: November 08, 1955  Age: 64 y.o. MRN: [id number]  CC:  Chief Complaint  Patient presents with  . Pre-op Exam    HPI Tina Mcgrath presents for pre-operative exam prior to orthopedic surgery. She did have labs performed at the orthopedic office including CMP, CBC, and PT/INR. According to paperwork received, she will need an EKG today along with physical exam. She does not have any current UTI symptoms, therefore we will defer the urinalysis at this time. Her most recent HbA1c was 11/07/2019 and found to be at 6.2%.  She denies chest pain, shortness of breath, palpitations, dizziness, lightheadedness, nausea, vomiting, diarrhea, constipation, polyuria, polydipsia, malodorous urine, dark urine, back pain, abdominal pain.  She does measure her blood pressures at home they typically run in the 120s/70s.  It can be increased at times of increased pain or when she has recently been active.   She does not check her blood sugars at home regular basis.  She is a current non-smoker: Quit smoking in September 1995.  She denies any significant concerns today she is looking forward to getting her surgery on May 17.  Past Medical History:  Diagnosis Date  . Cancer ([place])   . Dry eyes   . History of cancer of right breast 11/04/2015  . HSV-2 (herpes simplex virus 2) infection   . Migraines   . Obesity 03/02/2016  . Osteoarthritis   . Rosacea   . Sleep apnea     Past Surgical History:  Procedure Laterality Date  . BREAST SURGERY Right 09/07/2007  . CATARACT EXTRACTION Left 10/18/2019  . LASIK    . TOTAL HIP ARTHROPLASTY Left     Family History  Problem Relation Age of Onset  . Hyperlipidemia Mother   . Hypertension Mother   . Heart attack Father   . Hypertension Father   . Hyperlipidemia Father   . Diabetes Father   . Cancer Father        prostate and colon  . Alcohol abuse Brother   . Cancer Maternal Uncle         esophageal  . Cancer Paternal Aunt        breast  . Macular degeneration Maternal Grandmother   . Arthritis Maternal Grandmother   . Cancer Maternal Grandfather        multiple myeloma, bone marrow  . Stroke Paternal Grandmother   . Hypertension Paternal Grandmother   . Diabetes Paternal Grandmother   . Osteoporosis Paternal Grandmother   . Heart attack Paternal Grandmother   . Hypertension Paternal Grandfather     Social History   Socioeconomic History  . Marital status: Soil scientist    Spouse name: Not on file  . Number of children: Not on file  . Years of education: Not on file  . Highest education level: Not on file  Occupational History  . Not on file  Tobacco Use  . Smoking status: Former Research scientist (life sciences)  . Smokeless tobacco: Never Used  Substance and Sexual Activity  . Alcohol use: No    Alcohol/week: 0.0 standard drinks  . Drug use: No  . Sexual activity: Never  Other Topics Concern  . Not on file  Social History Narrative  . Not on file   Social Determinants of Health   Financial Resource Strain:   . Difficulty of Paying Living Expenses:   Food Insecurity:   . Worried About Charity fundraiser in the  Last Year:   . [place] in the Last Year:   Transportation Needs:   . Film/video editor (Medical):   [name] Lack of Transportation (Non-Medical):   Physical Activity:   . Days of Exercise per Week:   . Minutes of Exercise per Session:   Stress:   . Feeling of Stress :   Social Connections:   . Frequency of Communication with Friends and Family:   . Frequency of Social Gatherings with Friends and Family:   . Attends Religious Services:   . Active Member of Clubs or Organizations:   . Attends Archivist Meetings:   [name] Marital Status:   Intimate Partner Violence:   . Fear of Current or Ex-Partner:   . Emotionally Abused:   [name] Physically Abused:   . Sexually Abused:     Outpatient Medications Prior to Visit  Medication Sig Dispense  Refill  . Acetaminophen (TYLENOL EXTRA STRENGTH PO) Take by mouth as needed.    . Cholecalciferol (VITAMIN D3) 5000 units TABS Take 5,000 Units by mouth 2 (two) times a day.     . Coenzyme Q10 (COQ10) 100 MG CAPS Take by mouth daily.    . Dulaglutide (TRULICITY) 1.5 FV/4.9SW SOPN 1.64m weekly into skin. 12 pen 0  . ezetimibe (ZETIA) 10 MG tablet Take 1 tablet (10 mg total) by mouth daily. 90 tablet 3  . fexofenadine (ALLEGRA) 180 MG tablet Take 180 mg by mouth daily.    . Omega-3 Krill Oil 500 MG CAPS Take 1,000 mg by mouth 2 (two) times daily.     . propranolol (INDERAL) 40 MG tablet Take 40 mg by mouth daily.    . SUMAtriptan (IMITREX) 100 MG tablet Take 1 tablet (100 mg total) by mouth as needed for migraine or headache. May repeat in 2 hours if headache persists or recurs. 10 tablet 2  . valACYclovir (VALTREX) 500 MG tablet TAKE 1 TABLET BY MOUTH DAILY AS NEEDED 90 tablet 4  . AMBULATORY NON FORMULARY MEDICATION Tumeric 5036mtwice a day    . colesevelam (WELCHOL) 625 MG tablet Take 3 tablets (1,875 mg total) by mouth 2 (two) times daily with a meal. 540 tablet 2  . dexlansoprazole (DEXILANT) 60 MG capsule Take 1 capsule (60 mg total) by mouth daily. 90 capsule 3  . loratadine (CLARITIN) 10 MG tablet Take 10 mg by mouth daily.     No facility-administered medications prior to visit.    Allergies  Allergen Reactions  . Sulfa Antibiotics Hives  . Doxycycline Hyclate Nausea And Vomiting  . Adhesive [Tape] Itching and Rash  . Augmentin [Amoxicillin-Pot Clavulanate] Other (See Comments)    GI upset  . Ciprofloxacin Itching and Rash  . Crestor [Rosuvastatin]     Muscle cramps  . Femara [Letrozole]     Muscle pain and body froze up.   . Lipitor [Atorvastatin]     Muscle cramps  . Livalo [Pitavastatin]     Muscle cramps  . Zocor [Simvastatin]     Cramps/muscle cramps     ROS    Objective:    Physical Exam  Constitutional: She is oriented to person, place, and time. She  appears well-developed and well-nourished.  HENT:  Head: Normocephalic and atraumatic.  Right Ear: External ear normal.  Left Ear: External ear normal.  Nose: Nose normal.  Mouth/Throat: Oropharynx is clear and moist.  Eyes: Pupils are equal, round, and reactive to light. Conjunctivae and EOM are normal.  Neck: No JVD  present. No tracheal deviation present. No thyromegaly present.  Cardiovascular: Normal rate, regular rhythm, normal heart sounds and intact distal pulses.  Pulmonary/Chest: Effort normal and breath sounds normal. No respiratory distress. She has no wheezes. She exhibits no tenderness.  Abdominal: Soft. Bowel sounds are normal. She exhibits no distension. There is no abdominal tenderness.  Musculoskeletal:        General: No edema.     Cervical back: Normal range of motion and neck supple.     Right hip: Tenderness present. Decreased range of motion.     Right knee: Decreased range of motion. Tenderness present.  Lymphadenopathy:    She has no cervical adenopathy.  Neurological: She is alert and oriented to person, place, and time. She has normal reflexes. She displays normal reflexes. No cranial nerve deficit.  Skin: Skin is warm and dry.  Psychiatric: She has a normal mood and affect. Her behavior is normal. Judgment and thought content normal.  Nursing note and vitals reviewed.   BP (!) 144/72   Pulse (!) 113   Ht 5' 2.5" (1.588 m)   Wt 220 lb (99.8 kg)   SpO2 98%   BMI 39.60 kg/m  Wt Readings from Last 3 Encounters:  01/14/20 220 lb (99.8 kg)  11/14/19 218 lb (98.9 kg)  07/10/19 202 lb (91.6 kg)     There are no preventive care reminders to display for this patient.  There are no preventive care reminders to display for this patient.  Lab Results  Component Value Date   TSH 4.35 01/20/2018   Lab Results  Component Value Date   WBC 10.5 01/14/2017   HGB 14.4 01/14/2017   HCT 42.7 01/14/2017   MCV 87.1 01/14/2017   PLT 288 01/14/2017   Lab  Results  Component Value Date   NA 139 11/07/2019   K 4.6 11/07/2019   CO2 27 11/07/2019   GLUCOSE 118 (H) 11/07/2019   BUN 11 11/07/2019   CREATININE 0.57 11/07/2019   BILITOT 0.4 11/07/2019   ALKPHOS 57 12/27/2016   AST 20 11/07/2019   ALT 33 (H) 11/07/2019   PROT 6.4 11/07/2019   ALBUMIN 4.3 12/27/2016   CALCIUM 9.1 11/07/2019   ANIONGAP 12 01/14/2017   Lab Results  Component Value Date   CHOL 234 (H) 11/07/2019   Lab Results  Component Value Date   HDL 49 (L) 11/07/2019   Lab Results  Component Value Date   LDLCALC 151 (H) 11/07/2019   Lab Results  Component Value Date   TRIG 195 (H) 11/07/2019   Lab Results  Component Value Date   CHOLHDL 4.8 11/07/2019   Lab Results  Component Value Date   HGBA1C 6.2 (H) 11/07/2019      Assessment & Plan:   1. Preop testing Physical exam unremarkable for any concerns that would warrant hesitation with proceeding with surgery today.  Her blood pressure was slightly elevated in the office today, however, this is likely due to increased pain at the current time and recent walking through the office. Her blood pressures at home at rest are reportedly WNL.  Her heart rate was initially elevated but did return to below 90 towards the end of the visit.  From my standpoint, she is medically cleared to proceed with her orthopedic procedure on 01/21/20 at this time.  BMI: 39.11 today. Hgb: 14.7. Albumin: 4.3. Hgb A1c 6.2%. EKG: ST rate 110, PR interval 144 ms, QRS duration 72 ms, QTC 443 ms.  - EKG 12-Lead  Return if symptoms worsen or fail to improve.  Orma Render, NP

## 2020-01-14 NOTE — Progress Notes (Signed)
Note, clearance, labs, and EKG faxed to [facility] at [phone] attn: Judeen Hammans with confirmation received.

## 2020-02-11 ENCOUNTER — Other Ambulatory Visit: Payer: Self-pay

## 2020-02-11 ENCOUNTER — Encounter: Payer: Self-pay | Admitting: Physician Assistant

## 2020-02-11 ENCOUNTER — Ambulatory Visit: Payer: BC Managed Care – PPO | Admitting: Physician Assistant

## 2020-02-11 VITALS — BP 136/77 | HR 95 | Temp 98.6°F | Ht 62.5 in | Wt 221.8 lb

## 2020-02-11 DIAGNOSIS — E119 Type 2 diabetes mellitus without complications: Secondary | ICD-10-CM | POA: Diagnosis not present

## 2020-02-11 DIAGNOSIS — Z9849 Cataract extraction status, unspecified eye: Secondary | ICD-10-CM

## 2020-02-11 DIAGNOSIS — E782 Mixed hyperlipidemia: Secondary | ICD-10-CM

## 2020-02-11 DIAGNOSIS — E1169 Type 2 diabetes mellitus with other specified complication: Secondary | ICD-10-CM

## 2020-02-11 DIAGNOSIS — Z96641 Presence of right artificial hip joint: Secondary | ICD-10-CM

## 2020-02-11 DIAGNOSIS — Z6839 Body mass index (BMI) 39.0-39.9, adult: Secondary | ICD-10-CM

## 2020-02-11 DIAGNOSIS — E785 Hyperlipidemia, unspecified: Secondary | ICD-10-CM

## 2020-02-11 LAB — POCT GLYCOSYLATED HEMOGLOBIN (HGB A1C): Hemoglobin A1C: 6.3 % — AB (ref 4.0–5.6)

## 2020-02-11 LAB — POCT UA - MICROALBUMIN
Creatinine, POC: 10 mg/dL
Microalbumin Ur, POC: 10 mg/L

## 2020-02-11 MED ORDER — RYBELSUS 3 MG PO TABS: 1.0000 | ORAL_TABLET | Freq: Every day | ORAL | 0 refills | Status: DC

## 2020-02-11 MED ORDER — RYBELSUS 7 MG PO TABS: 1.0000 | ORAL_TABLET | Freq: Every day | ORAL | 0 refills | Status: DC

## 2020-02-11 NOTE — Progress Notes (Signed)
Subjective:    Patient ID: Tina Mcgrath, female    DOB: 08/26/1956, 64 y.o.   MRN: [id number]  HPI  Patient is a 64 year old female with type 2 diabetes, hyperlipidemia, hypertriglyceridemia, vitamin D deficiency, status post right hip replacement, status post bilateral cataract extraction who presents to the clinic for 22-month follow-up.  Overall she is doing well.  She is healing well from her right hip replacement.  She is excited about being able to walk more pain-free.  She is able to see 20/20 in both eyes after her cataract extraction.  She continues to take Trulicity weekly.  She does not like the injection.  Every time she uses the Trulicity it seems to break blood vessels in her skin.  She cannot tolerate statins.  She stopped lisinopril months ago because it was making her dizzy.  She denies any hypoglycemia.  She denies any open sores or wounds.  She tries to maintain a low-carb low sugar diet.  She was being more active but she has not been able to since her surgery.   .. Active Ambulatory Problems    Diagnosis Date Noted  . Hyperlipidemia 08/19/2014  . Elevated vitamin B12 level 08/19/2014  . Hematuria 08/19/2014  . Primary osteoarthritis of left knee 08/19/2014  . Dry eyes 08/19/2014  . Migraine without aura and with status migrainosus, not intractable 08/19/2014  . Mild sleep apnea 08/19/2014  . Fecal incontinence 08/19/2014  . Elevated triglycerides with high cholesterol 09/03/2014  . Controlled diabetes mellitus type II without complication ([place]) 40/98/1191  . Proteinuria 09/10/2014  . Elevated blood pressure 11/13/2014  . Chronic cystitis 01/06/2015  . Vitamin D deficiency 08/27/2015  . History of cancer of right breast 11/04/2015  . Class 2 severe obesity due to excess calories with serious comorbidity and body mass index (BMI) of 39.0 to 39.9 in adult ([place]) 03/02/2016  . Cough 03/02/2016  . Carpal tunnel syndrome on both sides 08/23/2016  . Ulnar neuropathy of  both upper extremities 08/23/2016  . Glaucoma suspect of both eyes 08/25/2016  . Elevated liver enzymes 11/28/2016  . Onychomycosis 11/28/2016  . Fatty liver disease, nonalcoholic 47/82/9562  . Hyperlipidemia associated with type 2 diabetes mellitus ([place]) 12/20/2017  . Osteoarthritis of right hip 03/23/2018  . Trouble in sleeping 06/28/2018  . Grief reaction 06/28/2018  . Hip flexor tendinitis, right 06/28/2018  . Statin intolerance 06/29/2018  . Gastroesophageal reflux disease 11/14/2019  . Family history of esophageal cancer 12/12/2019  . Status post cataract extraction 02/12/2020  . Status post right hip replacement 02/12/2020   Resolved Ambulatory Problems    Diagnosis Date Noted  . Impaired fasting glucose 08/19/2014  . Breast cancer ([place]) 11/13/2014  . Post-viral cough syndrome 11/04/2015  . Prediabetes 11/21/2015  . Right groin pain 03/23/2018   Past Medical History:  Diagnosis Date  . Cancer ([place])   . HSV-2 (herpes simplex virus 2) infection   . Migraines   . Obesity 03/02/2016  . Osteoarthritis   . Rosacea   . Sleep apnea       Review of Systems  All other systems reviewed and are negative.      Objective:   Physical Exam Vitals reviewed.  Constitutional:      Appearance: Normal appearance. She is obese.  Neck:     Vascular: No carotid bruit.  Cardiovascular:     Rate and Rhythm: Normal rate and regular rhythm.     Pulses: Normal pulses.     Heart sounds: Normal  heart sounds.  Neurological:     General: No focal deficit present.     Mental Status: She is alert and oriented to person, place, and time.  Psychiatric:        Mood and Affect: Mood normal.     .. Depression screen [facility] 2/9 04/09/2019 09/27/2018 03/21/2018 09/23/2017 11/26/2016  Decreased Interest 0 1 0 0 0  Down, Depressed, Hopeless 1 1 0 1 0  PHQ - 2 Score 1 2 0 1 0  Altered sleeping 2 2 2 1  -  Tired, decreased energy 0 0 0 0 -  Change in appetite 0 0 0 0 -  Feeling bad or failure  about yourself  0 0 0 0 -  Trouble concentrating 0 0 0 0 -  Moving slowly or fidgety/restless 0 0 0 0 -  Suicidal thoughts 0 0 0 0 -  PHQ-9 Score 3 4 2 2  -  Difficult doing work/chores Not difficult at all Not difficult at all Not difficult at all Not difficult at all -         Assessment & Plan:  [name] KitchenMarland KitchenDiagnoses and all orders for this visit:  Controlled type 2 diabetes mellitus without complication, without long-term current use of insulin (HCC) -     POCT UA - Microalbumin -     POCT glycosylated hemoglobin (Hb A1C) -     Semaglutide (RYBELSUS) 3 MG TABS; Take 1 tablet by mouth daily. With 4oz of water 30 minutes before first food, drink, or medication. For 30 days. -     Semaglutide (RYBELSUS) 7 MG TABS; Take 1 tablet by mouth daily.  Hyperlipidemia associated with type 2 diabetes mellitus (HCC)  Elevated triglycerides with high cholesterol  Class 2 severe obesity due to excess calories with serious comorbidity and body mass index (BMI) of 39.0 to 39.9 in adult [facility])  Status post cataract extraction, unspecified laterality  Status post right hip replacement    Results for orders placed or performed in visit on 02/11/20  POCT UA - Microalbumin  Result Value Ref Range   Microalbumin Ur, POC 10 mg/L   Creatinine, POC 10 mg/dL   Albumin/Creatinine Ratio, Urine, POC 30-300   POCT glycosylated hemoglobin (Hb A1C)  Result Value Ref Range   Hemoglobin A1C 6.3 (A) 4.0 - 5.6 %   HbA1c POC (<> result, manual entry)     HbA1c, POC (prediabetic range)     HbA1c, POC (controlled diabetic range)     A1C still to goal.  She really hates the trulicity injections. They hurt and cause a lot of bruising. She would like to try oral. She agrees if oral works then she would consider repatha to get LDL to goal since she cannot tolerate a statin.  Stop trulicity.  Start Rybelsus. Abnormal microalbumin. Declines ACE/ARB due to last time she tried causing dizziness. BP not to goal. Pt aware of  risk. GFR is 100 she does have a really good kidney function.  Eye exam UTD. Foot exam UTD.  Declined pneumonia vaccine.  Follow up in 3 months.   Right hip replacement. Doing well. Using cane getting around better and better.

## 2020-02-11 NOTE — Patient Instructions (Signed)
Will switch to oral pill from Trulicity.

## 2020-02-12 ENCOUNTER — Telehealth: Payer: Self-pay | Admitting: Physician Assistant

## 2020-02-12 DIAGNOSIS — Z96641 Presence of right artificial hip joint: Secondary | ICD-10-CM | POA: Insufficient documentation

## 2020-02-12 DIAGNOSIS — Z9849 Cataract extraction status, unspecified eye: Secondary | ICD-10-CM | POA: Insufficient documentation

## 2020-02-12 NOTE — Telephone Encounter (Signed)
Received fax for PA on Rybelsus 3mg  sent through cover my meds waiting on determination - CF

## 2020-02-12 NOTE — Telephone Encounter (Signed)
Received fax for PA on Rybelsus 7mg  sent through cover my meds waiting on determination. - CF

## 2020-02-13 ENCOUNTER — Ambulatory Visit: Payer: BC Managed Care – PPO | Admitting: Physician Assistant

## 2020-02-13 NOTE — Telephone Encounter (Signed)
Received fax from [facility] and they denied coverage on Rybelsus due to patient must try and fail formulary alternatives first. Placing in providers box for review. - CF

## 2020-02-13 NOTE — Telephone Encounter (Signed)
Received Fax for [facility] and they denied coverage on Rybelsus due to patient has to try and fail formulary alternatives first. Placing in providers box for review. - CF

## 2020-02-19 ENCOUNTER — Encounter: Payer: Self-pay | Admitting: Physician Assistant

## 2020-02-19 NOTE — Telephone Encounter (Signed)
Please add to chart. I printed already.

## 2020-02-19 NOTE — Telephone Encounter (Signed)
Report abstracted.

## 2020-02-20 ENCOUNTER — Telehealth: Payer: Self-pay | Admitting: Physician Assistant

## 2020-02-20 NOTE — Telephone Encounter (Signed)
She has tried and failed options trulicity(needles were painful and bruises), metformin GI side effects.

## 2020-02-20 NOTE — Telephone Encounter (Signed)
From 02/13/2020:  Tina Mcgrath   CF  10:30 AM Note Received fax from [facility] and they denied coverage on Rybelsus due to patient must try and fail formulary alternatives first. Placing in providers box for review. - CF

## 2020-02-20 NOTE — Telephone Encounter (Signed)
Can we find out if she got the ryelbus covered? I just had lunch with the rep and said to contact her if any problems.

## 2020-04-09 ENCOUNTER — Encounter: Payer: Self-pay | Admitting: Physician Assistant

## 2020-04-09 NOTE — Telephone Encounter (Signed)
Rx not listed in active med list.  

## 2020-04-11 MED ORDER — PANTOPRAZOLE SODIUM 40 MG PO TBEC
40.0000 mg | DELAYED_RELEASE_TABLET | Freq: Every day | ORAL | 3 refills | Status: DC
Start: 2020-04-11 — End: 2020-09-18

## 2020-04-16 ENCOUNTER — Encounter: Payer: Self-pay | Admitting: Physician Assistant

## 2020-04-16 LAB — PROTIME-INR: INR: 0.9 (ref 0.9–1.1)

## 2020-04-16 LAB — PROTEIN, TOTAL: Total Protein: 6.9 (ref 6.4–8.2)

## 2020-04-21 ENCOUNTER — Telehealth: Payer: Self-pay | Admitting: Physician Assistant

## 2020-04-21 NOTE — Telephone Encounter (Signed)
Resubmitted PA's for the 3mg  and 7mg  Rybelsus through cover my meds waiting on determination. - CF

## 2020-04-23 NOTE — Telephone Encounter (Signed)
I received faxed determinations on both the 3 and 7 mg Rybelsus. They are still denying coverage. I am placing in Providers box again for review. - CF

## 2020-04-23 NOTE — Telephone Encounter (Signed)
Error

## 2020-05-06 NOTE — Telephone Encounter (Signed)
Can discuss at visit on Tuesday.

## 2020-05-06 NOTE — Telephone Encounter (Signed)
Per [name] "find out if patient needs trulicity"  Mychart message sent to patient.

## 2020-05-13 ENCOUNTER — Other Ambulatory Visit: Payer: Self-pay

## 2020-05-13 ENCOUNTER — Ambulatory Visit: Payer: BC Managed Care – PPO | Admitting: Physician Assistant

## 2020-05-13 ENCOUNTER — Encounter: Payer: Self-pay | Admitting: Physician Assistant

## 2020-05-13 VITALS — BP 128/73 | HR 100 | Ht 62.5 in | Wt 223.0 lb

## 2020-05-13 DIAGNOSIS — E1169 Type 2 diabetes mellitus with other specified complication: Secondary | ICD-10-CM | POA: Diagnosis not present

## 2020-05-13 DIAGNOSIS — Z1231 Encounter for screening mammogram for malignant neoplasm of breast: Secondary | ICD-10-CM

## 2020-05-13 DIAGNOSIS — Z853 Personal history of malignant neoplasm of breast: Secondary | ICD-10-CM | POA: Diagnosis not present

## 2020-05-13 DIAGNOSIS — E785 Hyperlipidemia, unspecified: Secondary | ICD-10-CM

## 2020-05-13 DIAGNOSIS — E119 Type 2 diabetes mellitus without complications: Secondary | ICD-10-CM

## 2020-05-13 LAB — POCT GLYCOSYLATED HEMOGLOBIN (HGB A1C): Hemoglobin A1C: 6.7 % — AB (ref 4.0–5.6)

## 2020-05-13 NOTE — Progress Notes (Signed)
Subjective:    Patient ID: Tina Mcgrath, female    DOB: 1955/12/23, 64 y.o.   MRN: [id number]  HPI Pt is a 64 yo female with T2DM, HLD, Migraines, OSA, GERD, osteoarthritis who presents to the clinic for 3 month follow up.   Overall she is doing good. Not checking sugars. Taking trulicity still despite the bruising at injection site. No hypoglycemic events. No open sores or wounds.   Pt had Total hip replacement in may 2021. She is doing "ok". She still is not as good as she wants to be. She is not able to walk like she desires due to pain.   .. Active Ambulatory Problems    Diagnosis Date Noted   Hyperlipidemia 08/19/2014   Elevated vitamin B12 level 08/19/2014   Hematuria 08/19/2014   Primary osteoarthritis of left knee 08/19/2014   Dry eyes 08/19/2014   Migraine without aura and with status migrainosus, not intractable 08/19/2014   Mild sleep apnea 08/19/2014   Fecal incontinence 08/19/2014   Elevated triglycerides with high cholesterol 09/03/2014   Controlled diabetes mellitus type II without complication ([place]) 94/49/6759   Proteinuria 09/10/2014   Elevated blood pressure 11/13/2014   Chronic cystitis 01/06/2015   Vitamin D deficiency 08/27/2015   History of right breast cancer 11/04/2015   Class 2 severe obesity due to excess calories with serious comorbidity and body mass index (BMI) of 39.0 to 39.9 in adult ([place]) 03/02/2016   Cough 03/02/2016   Carpal tunnel syndrome on both sides 08/23/2016   Ulnar neuropathy of both upper extremities 08/23/2016   Glaucoma suspect of both eyes 08/25/2016   Elevated liver enzymes 11/28/2016   Onychomycosis 11/28/2016   Fatty liver disease, nonalcoholic 16/38/4665   Hyperlipidemia associated with type 2 diabetes mellitus ([place]) 12/20/2017   Osteoarthritis of right hip 03/23/2018   Trouble in sleeping 06/28/2018   Grief reaction 06/28/2018   Hip flexor tendinitis, right 06/28/2018   Statin intolerance  06/29/2018   Gastroesophageal reflux disease 11/14/2019   Family history of esophageal cancer 12/12/2019   Status post cataract extraction 02/12/2020   Status post right hip replacement 02/12/2020   Resolved Ambulatory Problems    Diagnosis Date Noted   Impaired fasting glucose 08/19/2014   Breast cancer ([place]) 11/13/2014   Post-viral cough syndrome 11/04/2015   Prediabetes 11/21/2015   Right groin pain 03/23/2018   Past Medical History:  Diagnosis Date   Cancer [facility])    History of cancer of right breast 11/04/2015   HSV-2 (herpes simplex virus 2) infection    Migraines    Obesity 03/02/2016   Osteoarthritis    Rosacea    Sleep apnea       Review of Systems  All other systems reviewed and are negative.      Objective:   Physical Exam Vitals reviewed.  Constitutional:      Appearance: Normal appearance. She is obese.  HENT:     Head: Normocephalic.  Neck:     Vascular: No carotid bruit.  Cardiovascular:     Rate and Rhythm: Normal rate and regular rhythm.     Pulses: Normal pulses.     Heart sounds: Normal heart sounds.  Pulmonary:     Effort: Pulmonary effort is normal.     Breath sounds: Normal breath sounds.  Neurological:     General: No focal deficit present.  Psychiatric:        Mood and Affect: Mood normal.  Assessment & Plan:  [name] KitchenMarland KitchenDiagnoses and all orders for this visit:  Controlled type 2 diabetes mellitus without complication, without long-term current use of insulin (HCC) -     POCT glycosylated hemoglobin (Hb A1C) -     Semaglutide,0.25 or 0.5MG /DOS, (OZEMPIC, 0.25 OR 0.5 MG/DOSE,) 2 MG/1.5ML SOPN; Inject 0.5 mg into the skin once a week.  Encounter for screening mammogram for malignant neoplasm of breast  Hyperlipidemia associated with type 2 diabetes mellitus ([place]) -     Alirocumab (PRALUENT) 75 MG/ML SOAJ; Inject 75 mg into the skin every 14 (fourteen) days.  History of right breast cancer    Lab Results   Component Value Date   HGBA1C 6.7 (A) 05/13/2020   A1C is to goal.  trulicity causing a lot of pain.  Switch to ozempic weekly.  BP to goal.  Pt does not tolerate statins. Trying to get PsK-9 approved.  Foot exam UTD.  Eye exam UTD.  Declines pneumonia/covid/flu shots.  Follow up in 3 months.   Needs mammogram. Pt will schedule downstairs.

## 2020-05-13 NOTE — Patient Instructions (Signed)
Stop trulicity.  Start ozempic once weekly.

## 2020-05-21 ENCOUNTER — Encounter: Payer: Self-pay | Admitting: Physician Assistant

## 2020-05-21 DIAGNOSIS — Z1231 Encounter for screening mammogram for malignant neoplasm of breast: Secondary | ICD-10-CM

## 2020-05-21 DIAGNOSIS — Z853 Personal history of malignant neoplasm of breast: Secondary | ICD-10-CM

## 2020-05-22 NOTE — Telephone Encounter (Signed)
Not sure what dose of Ozempic you would like her to start, doesn't look like this was sent.   Reviewing chart, it does look like she has had diagnostic mammograms in the past due to history of breast cancer. Order pended. Please sign if appropriate.

## 2020-05-23 ENCOUNTER — Encounter: Payer: Self-pay | Admitting: Physician Assistant

## 2020-05-23 MED ORDER — PRALUENT 75 MG/ML ~~LOC~~ SOAJ: 75.0000 mg | SUBCUTANEOUS | 5 refills | Status: DC

## 2020-05-23 MED ORDER — OZEMPIC (0.25 OR 0.5 MG/DOSE) 2 MG/1.5ML ~~LOC~~ SOPN: 0.5000 mg | PEN_INJECTOR | SUBCUTANEOUS | 2 refills | Status: DC

## 2020-05-23 NOTE — Telephone Encounter (Signed)
Order printed, signed, and mailed to patient.

## 2020-05-23 NOTE — Telephone Encounter (Signed)
Can you mail her external order?

## 2020-05-27 ENCOUNTER — Telehealth: Payer: Self-pay | Admitting: Physician Assistant

## 2020-05-27 ENCOUNTER — Telehealth: Payer: Self-pay | Admitting: Neurology

## 2020-05-27 NOTE — Telephone Encounter (Signed)
Received vm from [facility] that Praluent needs prior authorization.

## 2020-05-27 NOTE — Telephone Encounter (Signed)
Received fax for PA on Ozempic sent through cover my meds and received authorization.   Valid 05/27/20 - 05/27/23  I will fax to pharmacy so they can fill prescription for patient. - CF

## 2020-05-28 ENCOUNTER — Encounter: Payer: Self-pay | Admitting: Physician Assistant

## 2020-05-28 ENCOUNTER — Telehealth: Payer: Self-pay | Admitting: Physician Assistant

## 2020-05-28 NOTE — Telephone Encounter (Signed)
Can we check on ozempic approval she could not tolerate trulicity injection site reaction

## 2020-05-28 NOTE — Telephone Encounter (Signed)
Tina Mcgrath   I did the PA for her Ozempic yesterday and it was approved as well she should not have any problems filling this medication. - CF

## 2020-05-28 NOTE — Telephone Encounter (Signed)
Prior Tina Mcgrath was completed on 9/21 and approved patient can get medication filled at pharmacy - CF

## 2020-05-28 NOTE — Telephone Encounter (Signed)
Received fax for PA on Praluent sent through cover my meds and received authorization. I am faxing to pharmacy for them fill medication for patient. - CF

## 2020-06-01 ENCOUNTER — Other Ambulatory Visit: Payer: Self-pay | Admitting: Physician Assistant

## 2020-06-01 DIAGNOSIS — E119 Type 2 diabetes mellitus without complications: Secondary | ICD-10-CM

## 2020-06-02 ENCOUNTER — Encounter: Payer: Self-pay | Admitting: Physician Assistant

## 2020-07-01 ENCOUNTER — Telehealth: Payer: Self-pay | Admitting: Neurology

## 2020-07-01 DIAGNOSIS — Z853 Personal history of malignant neoplasm of breast: Secondary | ICD-10-CM

## 2020-07-01 NOTE — Telephone Encounter (Signed)
Jenny Reichmann states that patient needs order for a breast ultrasound along with diagnostic mammogram for [facility]. It looks like diagnostic mammogram is because of history of right lumbectomy for lobular carcinoma, no masses or anything. Please sign if you would like to order.

## 2020-07-01 NOTE — Telephone Encounter (Signed)
Signed.

## 2020-07-01 NOTE — Telephone Encounter (Signed)
FYI Cindy

## 2020-07-18 LAB — HM MAMMOGRAPHY

## 2020-07-25 ENCOUNTER — Encounter: Payer: Self-pay | Admitting: Physician Assistant

## 2020-07-28 MED ORDER — BENZONATATE 200 MG PO CAPS: 200.0000 mg | ORAL_CAPSULE | Freq: Two times a day (BID) | ORAL | 1 refills | Status: DC | PRN

## 2020-07-28 NOTE — Addendum Note (Signed)
Addended by: Donella Stade on: 07/28/2020 02:21 PM   Modules accepted: Orders

## 2020-08-08 ENCOUNTER — Other Ambulatory Visit: Payer: Self-pay

## 2020-08-08 ENCOUNTER — Encounter: Payer: Self-pay | Admitting: Physician Assistant

## 2020-08-08 ENCOUNTER — Ambulatory Visit (INDEPENDENT_AMBULATORY_CARE_PROVIDER_SITE_OTHER): Payer: BC Managed Care – PPO | Admitting: Physician Assistant

## 2020-08-08 ENCOUNTER — Ambulatory Visit (INDEPENDENT_AMBULATORY_CARE_PROVIDER_SITE_OTHER): Payer: BC Managed Care – PPO

## 2020-08-08 VITALS — BP 149/77 | HR 100 | Ht 62.5 in | Wt 218.0 lb

## 2020-08-08 DIAGNOSIS — R059 Cough, unspecified: Secondary | ICD-10-CM

## 2020-08-08 DIAGNOSIS — J209 Acute bronchitis, unspecified: Secondary | ICD-10-CM | POA: Diagnosis not present

## 2020-08-08 DIAGNOSIS — R053 Chronic cough: Secondary | ICD-10-CM

## 2020-08-08 MED ORDER — ALBUTEROL SULFATE HFA 108 (90 BASE) MCG/ACT IN AERS: 2.0000 | INHALATION_SPRAY | Freq: Four times a day (QID) | RESPIRATORY_TRACT | 0 refills | Status: DC | PRN

## 2020-08-08 MED ORDER — PREDNISONE 20 MG PO TABS: ORAL_TABLET | ORAL | 0 refills | Status: DC

## 2020-08-08 NOTE — Progress Notes (Signed)
CxR looks clear.

## 2020-08-08 NOTE — Progress Notes (Addendum)
Acute Office Visit  Subjective:    Patient ID: Tina Mcgrath, female    DOB: [birth year], 64 y.o.   MRN: [id number]  Chief Complaint  Patient presents with  . Cough    Cough The current episode started in the past 7 days. The problem has been gradually worsening. The problem occurs constantly. The cough is productive of sputum. Associated symptoms include postnasal drip, rhinorrhea and shortness of breath. Pertinent negatives include no chest pain, fever, sore throat, sweats or wheezing. The symptoms are aggravated by lying down. She has tried OTC cough suppressant for the symptoms. The treatment provided no relief. Her past medical history is significant for bronchitis.  Cough is worse on expiration.   Patient is in today for persistent cough for the last 3 months that has worsened over the last three days. Voice has also become hoarse recently.   Tessalon pearls and mucinex DM do not provide relief. Flonase also doesn't help. Pepcid did not have any effect on symptoms.    Past Medical History:  Diagnosis Date  . Cancer ([place])   . Dry eyes   . History of cancer of right breast 11/04/2015  . HSV-2 (herpes simplex virus 2) infection   . Migraines   . Obesity 03/02/2016  . Osteoarthritis   . Rosacea   . Sleep apnea     Past Surgical History:  Procedure Laterality Date  . BREAST SURGERY Right 09/07/2007  . CATARACT EXTRACTION Left 10/18/2019  . JOINT REPLACEMENT Right    hip  . LASIK    . TOTAL HIP ARTHROPLASTY Left     Family History  Problem Relation Age of Onset  . Hyperlipidemia Mother   . Hypertension Mother   . Heart attack Father   . Hypertension Father   . Hyperlipidemia Father   . Diabetes Father   . Cancer Father        prostate and colon  . Alcohol abuse Brother   . Cancer Maternal Uncle        esophageal  . Cancer Paternal Aunt        breast  . Macular degeneration Maternal Grandmother   . Arthritis Maternal Grandmother   . Cancer Maternal Grandfather         multiple myeloma, bone marrow  . Stroke Paternal Grandmother   . Hypertension Paternal Grandmother   . Diabetes Paternal Grandmother   . Osteoporosis Paternal Grandmother   . Heart attack Paternal Grandmother   . Hypertension Paternal Grandfather       Outpatient Medications Prior to Visit  Medication Sig Dispense Refill  . Acetaminophen (TYLENOL EXTRA STRENGTH PO) Take by mouth as needed.    . Alirocumab (PRALUENT) 75 MG/ML SOAJ Inject 75 mg into the skin every 14 (fourteen) days. 2 mL 5  . benzonatate (TESSALON) 200 MG capsule Take 1 capsule (200 mg total) by mouth 2 (two) times daily as needed for cough. 20 capsule 1  . Cholecalciferol (VITAMIN D3) 5000 units TABS Take 5,000 Units by mouth 2 (two) times a day.     . Coenzyme Q10 (COQ10) 100 MG CAPS Take by mouth daily.    [name] ezetimibe (ZETIA) 10 MG tablet Take 1 tablet (10 mg total) by mouth daily. 90 tablet 3  . fexofenadine (ALLEGRA) 180 MG tablet Take 180 mg by mouth daily.    . Omega-3 Krill Oil 500 MG CAPS Take 1,000 mg by mouth 2 (two) times daily.     . pantoprazole (PROTONIX) 40 MG tablet Take 1  tablet (40 mg total) by mouth daily. 90 tablet 3  . Semaglutide,0.25 or 0.5MG/DOS, (OZEMPIC, 0.25 OR 0.5 MG/DOSE,) 2 MG/1.5ML SOPN Inject 0.5 mg into the skin once a week. 1.5 mL 2  . SUMAtriptan (IMITREX) 100 MG tablet Take 1 tablet (100 mg total) by mouth as needed for migraine or headache. May repeat in 2 hours if headache persists or recurs. 10 tablet 2  . valACYclovir (VALTREX) 500 MG tablet TAKE 1 TABLET BY MOUTH DAILY AS NEEDED 90 tablet 4  . Dulaglutide (TRULICITY) 1.5 WN/4.6EV SOPN INJECT 1 PEN INTO THE SKIN ONCE WEEKLY 6 mL 1   No facility-administered medications prior to visit.    Allergies  Allergen Reactions  . Sulfa Antibiotics Hives  . Doxycycline Hyclate Nausea And Vomiting  . Lisinopril     Dizziness.  . Adhesive [Tape] Itching and Rash  . Augmentin [Amoxicillin-Pot Clavulanate] Other (See Comments)    GI  upset  . Ciprofloxacin Itching and Rash  . Crestor [Rosuvastatin]     Muscle cramps  . Femara [Letrozole]     Muscle pain and body froze up.   . Lipitor [Atorvastatin]     Muscle cramps  . Livalo [Pitavastatin]     Muscle cramps  . Zocor [Simvastatin]     Cramps/muscle cramps     Review of Systems  Constitutional: Negative for fever.  HENT: Positive for postnasal drip and rhinorrhea. Negative for sore throat.   Respiratory: Positive for cough and shortness of breath. Negative for wheezing.   Cardiovascular: Negative for chest pain.       Objective:    Physical Exam Vitals reviewed.  Constitutional:      Appearance: Normal appearance. She is obese.  HENT:     Head: Normocephalic.     Right Ear: Tympanic membrane, ear canal and external ear normal. There is no impacted cerumen.     Left Ear: Tympanic membrane, ear canal and external ear normal. There is no impacted cerumen.     Nose: Nose normal. No congestion.     Mouth/Throat:     Mouth: Mucous membranes are moist.  Eyes:     Conjunctiva/sclera: Conjunctivae normal.  Cardiovascular:     Rate and Rhythm: Normal rate and regular rhythm.     Pulses: Normal pulses.  Pulmonary:     Effort: Pulmonary effort is normal.     Breath sounds: Rhonchi present. No wheezing.  Abdominal:     General: Bowel sounds are normal.     Palpations: Abdomen is soft.  Musculoskeletal:     Right lower leg: No edema.     Left lower leg: No edema.  Neurological:     General: No focal deficit present.     Mental Status: She is alert and oriented to person, place, and time.  Psychiatric:        Mood and Affect: Mood normal.        Behavior: Behavior normal.     BP (!) 149/77   Pulse 100   Ht 5' 2.5" (1.588 m)   Wt 218 lb (98.9 kg)   SpO2 100%   BMI 39.24 kg/m  Wt Readings from Last 3 Encounters:  08/08/20 218 lb (98.9 kg)  05/13/20 223 lb (101.2 kg)  02/11/20 221 lb 12.8 oz (100.6 kg)    Health Maintenance Due  Topic Date  Due  . PNEUMOCOCCAL POLYSACCHARIDE VACCINE AGE 109-64 HIGH RISK  Never done  . COVID-19 Vaccine (1) Never done  . FOOT EXAM  07/09/2020  Assessment & Plan:  [name] KitchenMarland KitchenLexia was seen today for cough.  Diagnoses and all orders for this visit:  Acute bronchitis, unspecified organism -     DG Chest 2 View -     predniSONE (DELTASONE) 20 MG tablet; Take 3 tablets for 3 days, take 2 tablets for 3 days, take 1 tablet for 3 days, take 1/2 tablet for 4 days. -     albuterol (VENTOLIN HFA) 108 (90 Base) MCG/ACT inhaler; Inhale 2 puffs into the lungs every 6 (six) hours as needed.  Persistent cough for 3 weeks or longer -     DG Chest 2 View   Acute on chronic cough. Before last 3 months chronic cough had improved with protonix. She remains on protonix with pepcid with no relief. She continues allegra and flonase for allergies.  Vitals are ok despite slightly elevated BP. Due to cough longevitiy will get CXR. For now treated with prednisone and albuterol. Ok to continue to use delsym and tessalon pearles. Could consider singulair or a short term BREO inhaler. We could get spirometry in the future to look for asthma and need for daily ICS inhaler. Rest and hydrate. If symptoms worsening let me know.   [name] KitchenVernetta Honey PA-C, have reviewed and agree with the above documentation in it's entirety.

## 2020-08-18 ENCOUNTER — Ambulatory Visit (INDEPENDENT_AMBULATORY_CARE_PROVIDER_SITE_OTHER): Payer: BC Managed Care – PPO | Admitting: Physician Assistant

## 2020-08-18 ENCOUNTER — Encounter: Payer: Self-pay | Admitting: Physician Assistant

## 2020-08-18 VITALS — BP 132/68 | HR 100 | Ht 62.5 in | Wt 217.0 lb

## 2020-08-18 DIAGNOSIS — E119 Type 2 diabetes mellitus without complications: Secondary | ICD-10-CM

## 2020-08-18 LAB — POCT GLYCOSYLATED HEMOGLOBIN (HGB A1C): Hemoglobin A1C: 6.9 % — AB (ref 4.0–5.6)

## 2020-08-18 MED ORDER — OZEMPIC (1 MG/DOSE) 2 MG/1.5ML ~~LOC~~ SOPN: 1.0000 mg | PEN_INJECTOR | SUBCUTANEOUS | 2 refills | Status: DC

## 2020-08-18 NOTE — Progress Notes (Signed)
Subjective:    Patient ID: Tina Mcgrath, female    DOB: [birth year], 64 y.o.   MRN: [id number]  HPI  Pt is a 64 yo obese female with T2DM, migraines, Sleep apnea, GERD, HLD who presents to the clinic for medication refill and 3 month follow up.   She was seen last week for persistent cough. CXR was clear. pts cough has resolved with prednisone.   Pt is not checking her sugars. She is on ozempic. No hypoglycemic events. No open sores or wounds. She is watching her diet. She is walking some but no major exercise.   .. Active Ambulatory Problems    Diagnosis Date Noted  . Hyperlipidemia 08/19/2014  . Elevated vitamin B12 level 08/19/2014  . Hematuria 08/19/2014  . Primary osteoarthritis of left knee 08/19/2014  . Dry eyes 08/19/2014  . Migraine without aura and with status migrainosus, not intractable 08/19/2014  . Mild sleep apnea 08/19/2014  . Fecal incontinence 08/19/2014  . Elevated triglycerides with high cholesterol 09/03/2014  . Controlled diabetes mellitus type II without complication ([place]) 75/06/2584  . Proteinuria 09/10/2014  . Elevated blood pressure 11/13/2014  . Chronic cystitis 01/06/2015  . Vitamin D deficiency 08/27/2015  . History of right breast cancer 11/04/2015  . Class 2 severe obesity due to excess calories with serious comorbidity and body mass index (BMI) of 39.0 to 39.9 in adult ([place]) 03/02/2016  . Cough 03/02/2016  . Carpal tunnel syndrome on both sides 08/23/2016  . Ulnar neuropathy of both upper extremities 08/23/2016  . Glaucoma suspect of both eyes 08/25/2016  . Elevated liver enzymes 11/28/2016  . Onychomycosis 11/28/2016  . Fatty liver disease, nonalcoholic 27/78/2423  . Hyperlipidemia associated with type 2 diabetes mellitus ([place]) 12/20/2017  . Osteoarthritis of right hip 03/23/2018  . Trouble in sleeping 06/28/2018  . Grief reaction 06/28/2018  . Hip flexor tendinitis, right 06/28/2018  . Statin intolerance 06/29/2018  . Gastroesophageal reflux  disease 11/14/2019  . Family history of esophageal cancer 12/12/2019  . Status post cataract extraction 02/12/2020  . Status post right hip replacement 02/12/2020   Resolved Ambulatory Problems    Diagnosis Date Noted  . Impaired fasting glucose 08/19/2014  . Breast cancer ([place]) 11/13/2014  . Post-viral cough syndrome 11/04/2015  . Prediabetes 11/21/2015  . Right groin pain 03/23/2018   Past Medical History:  Diagnosis Date  . Cancer ([place])   . History of cancer of right breast 11/04/2015  . HSV-2 (herpes simplex virus 2) infection   . Migraines   . Obesity 03/02/2016  . Osteoarthritis   . Rosacea   . Sleep apnea       Review of Systems  All other systems reviewed and are negative.      Objective:   Physical Exam Vitals reviewed.  Constitutional:      Appearance: Normal appearance.  HENT:     Head: Normocephalic and atraumatic.  Cardiovascular:     Rate and Rhythm: Normal rate.     Pulses: Normal pulses.  Pulmonary:     Effort: Pulmonary effort is normal.     Breath sounds: Normal breath sounds.  Neurological:     General: No focal deficit present.     Mental Status: She is alert and oriented to person, place, and time.  Psychiatric:        Mood and Affect: Mood normal.           Assessment & Plan:  [name] KitchenMarland KitchenRylee was seen today for diabetes.  Diagnoses and all  orders for this visit:  Controlled type 2 diabetes mellitus without complication, without long-term current use of insulin (HCC) -     POCT HgB A1C  Other orders -     Semaglutide, 1 MG/DOSE, (OZEMPIC, 1 MG/DOSE,) 2 MG/1.5ML SOPN; Inject 1 mg into the skin once a week.   .. Results for orders placed or performed in visit on 08/18/20  POCT HgB A1C  Result Value Ref Range   Hemoglobin A1C 6.9 (A) 4.0 - 5.6 %   HbA1c POC (<> result, manual entry)     HbA1c, POC (prediabetic range)     HbA1c, POC (controlled diabetic range)     A1C is up from 3 months ago.  Increased ozempic to 1mg  weekly.   Continue praluent.  BP to goal.  Eye exam UTD.  Foot exam UTD.  Declines flu and pneumonia vaccines.  Follow up in 3months.

## 2020-08-21 ENCOUNTER — Other Ambulatory Visit: Payer: Self-pay | Admitting: Physician Assistant

## 2020-08-21 DIAGNOSIS — E119 Type 2 diabetes mellitus without complications: Secondary | ICD-10-CM

## 2020-09-02 ENCOUNTER — Encounter: Payer: Self-pay | Admitting: Physician Assistant

## 2020-09-02 MED ORDER — PREDNISONE 20 MG PO TABS: ORAL_TABLET | ORAL | 0 refills | Status: DC

## 2020-09-08 ENCOUNTER — Other Ambulatory Visit: Payer: Self-pay | Admitting: Physician Assistant

## 2020-09-08 DIAGNOSIS — J209 Acute bronchitis, unspecified: Secondary | ICD-10-CM

## 2020-09-10 LAB — HM DIABETES EYE EXAM

## 2020-09-17 MED ORDER — AZITHROMYCIN 250 MG PO TABS: ORAL_TABLET | ORAL | 0 refills | Status: DC

## 2020-09-17 NOTE — Addendum Note (Signed)
Addended by: Donella Stade on: 09/17/2020 01:13 PM   Modules accepted: Orders

## 2020-09-18 MED ORDER — PANTOPRAZOLE SODIUM 40 MG PO TBEC: 40.0000 mg | DELAYED_RELEASE_TABLET | Freq: Two times a day (BID) | ORAL | 0 refills | Status: DC

## 2020-09-18 NOTE — Addendum Note (Signed)
Addended byAnnamaria Helling on: 09/18/2020 11:16 AM   Modules accepted: Orders

## 2020-09-22 ENCOUNTER — Telehealth (INDEPENDENT_AMBULATORY_CARE_PROVIDER_SITE_OTHER): Payer: BC Managed Care – PPO | Admitting: Physician Assistant

## 2020-09-22 ENCOUNTER — Encounter: Payer: Self-pay | Admitting: Physician Assistant

## 2020-09-22 DIAGNOSIS — R0982 Postnasal drip: Secondary | ICD-10-CM

## 2020-09-22 DIAGNOSIS — R053 Chronic cough: Secondary | ICD-10-CM | POA: Diagnosis not present

## 2020-09-22 DIAGNOSIS — J3489 Other specified disorders of nose and nasal sinuses: Secondary | ICD-10-CM

## 2020-09-22 DIAGNOSIS — R109 Unspecified abdominal pain: Secondary | ICD-10-CM | POA: Diagnosis not present

## 2020-09-22 DIAGNOSIS — N3020 Other chronic cystitis without hematuria: Secondary | ICD-10-CM

## 2020-09-22 DIAGNOSIS — R490 Dysphonia: Secondary | ICD-10-CM

## 2020-09-22 MED ORDER — IPRATROPIUM BROMIDE 0.03 % NA SOLN: 2.0000 | Freq: Two times a day (BID) | NASAL | 5 refills | Status: DC

## 2020-09-22 MED ORDER — MONTELUKAST SODIUM 10 MG PO TABS: 10.0000 mg | ORAL_TABLET | Freq: Every day | ORAL | 0 refills | Status: DC

## 2020-09-22 MED ORDER — FLUCONAZOLE 150 MG PO TABS: 150.0000 mg | ORAL_TABLET | Freq: Once | ORAL | 0 refills | Status: AC

## 2020-09-22 MED ORDER — NITROFURANTOIN MONOHYD MACRO 100 MG PO CAPS: 100.0000 mg | ORAL_CAPSULE | Freq: Two times a day (BID) | ORAL | 0 refills | Status: DC

## 2020-09-22 NOTE — Progress Notes (Signed)
..Virtual Visit via Video Note  I connected with Tina Mcgrath on 09/22/2020 at  2:40 PM EST by a video enabled telemedicine application and verified that I am speaking with the correct person using two identifiers.  Location: Patient: home Provider: clinic  .[name] KitchenParticipating in visit:  Patient: Tina Mcgrath Provider: Iran Planas PA-c   I discussed the limitations of evaluation and management by telemedicine and the availability of in person appointments. The patient expressed understanding and agreed to proceed.  History of Present Illness: Patient is a 65 year old female with history of upper airway cough syndrome and voice hoarseness who presents to the clinic for follow-up.  She has recently been coughing for over 3 weeks.  She has been treated with a Z-Pak, steroid, albuterol.  She has no diagnosed asthma or lung disease.  She does not smoke.  She continues to have this chronic postnasal drip and sinus drainage.  It is so bad that it does keep her up at night.  She tried Flonase and helped minimally and ended up creating nosebleeds.  She is taking Allegra daily.  She denies any fever, chills, body aches.  She has had some new left flank pain that makes her suspicious for a UTI.  She never has dysuria with her UTIs.  She has ongoing chronic cystitis.  She denies any vomiting or nausea.  A few months ago patient did have a chronic cough and was sent to GI for full work-up.  They did place her on pantoprazole which initially she did great and helped cough significantly.  In the meantime we did increase to 2 pantoprazole twice a day which is helped minimally.  .. Active Ambulatory Problems    Diagnosis Date Noted  . Hyperlipidemia 08/19/2014  . Elevated vitamin B12 level 08/19/2014  . Hematuria 08/19/2014  . Primary osteoarthritis of left knee 08/19/2014  . Dry eyes 08/19/2014  . Migraine without aura and with status migrainosus, not intractable 08/19/2014  . Mild sleep apnea 08/19/2014  .  Fecal incontinence 08/19/2014  . Elevated triglycerides with high cholesterol 09/03/2014  . Controlled diabetes mellitus type II without complication ([place]) 62/69/4854  . Proteinuria 09/10/2014  . Elevated blood pressure 11/13/2014  . Chronic cystitis 01/06/2015  . Vitamin D deficiency 08/27/2015  . History of right breast cancer 11/04/2015  . Class 2 severe obesity due to excess calories with serious comorbidity and body mass index (BMI) of 39.0 to 39.9 in adult ([place]) 03/02/2016  . Cough 03/02/2016  . Carpal tunnel syndrome on both sides 08/23/2016  . Ulnar neuropathy of both upper extremities 08/23/2016  . Glaucoma suspect of both eyes 08/25/2016  . Elevated liver enzymes 11/28/2016  . Onychomycosis 11/28/2016  . Fatty liver disease, nonalcoholic 62/70/3500  . Hyperlipidemia associated with type 2 diabetes mellitus ([place]) 12/20/2017  . Osteoarthritis of right hip 03/23/2018  . Trouble in sleeping 06/28/2018  . Grief reaction 06/28/2018  . Hip flexor tendinitis, right 06/28/2018  . Statin intolerance 06/29/2018  . Gastroesophageal reflux disease 11/14/2019  . Family history of esophageal cancer 12/12/2019  . Status post cataract extraction 02/12/2020  . Status post right hip replacement 02/12/2020   Resolved Ambulatory Problems    Diagnosis Date Noted  . Impaired fasting glucose 08/19/2014  . Breast cancer ([place]) 11/13/2014  . Post-viral cough syndrome 11/04/2015  . Prediabetes 11/21/2015  . Right groin pain 03/23/2018   Past Medical History:  Diagnosis Date  . Cancer ([place])   . History of cancer of right breast 11/04/2015  .  HSV-2 (herpes simplex virus 2) infection   . Migraines   . Obesity 03/02/2016  . Osteoarthritis   . Rosacea   . Sleep apnea    Reviewed med, allergy, problem list.    Observations/Objective: No acute distress Normal mood and appearance Hoarse voice Dry cough  .[name] KitchenThere were no vitals filed for this visit. There is no height or weight on file to  calculate BMI.   Assessment and Plan: [name] KitchenMarland KitchenDiagnoses and all orders for this visit:  Sinus drainage -     ipratropium (ATROVENT) 0.03 % nasal spray; Place 2 sprays into both nostrils every 12 (twelve) hours. -     montelukast (SINGULAIR) 10 MG tablet; Take 1 tablet (10 mg total) by mouth at bedtime.  Post-nasal drip -     ipratropium (ATROVENT) 0.03 % nasal spray; Place 2 sprays into both nostrils every 12 (twelve) hours. -     montelukast (SINGULAIR) 10 MG tablet; Take 1 tablet (10 mg total) by mouth at bedtime.  Persistent cough -     ipratropium (ATROVENT) 0.03 % nasal spray; Place 2 sprays into both nostrils every 12 (twelve) hours. -     montelukast (SINGULAIR) 10 MG tablet; Take 1 tablet (10 mg total) by mouth at bedtime.  Left flank pain -     nitrofurantoin, macrocrystal-monohydrate, (MACROBID) 100 MG capsule; Take 1 capsule (100 mg total) by mouth 2 (two) times daily.  Chronic cystitis  Other orders -     fluconazole (DIFLUCAN) 150 MG tablet; Take 1 tablet (150 mg total) by mouth once for 1 dose. Repeat in 48-72 hours if symptoms persist.   Patient has persistent sinus drainage, postnasal drip, sinusitis symptoms.  I do not see any signs of infection.  She did not tolerate Flonase.  I did send over Atrovent to see if this was more beneficial as a nasal spray.  Hopefully this could help with postnasal drip.  I readded Singulair back to her Allegra.  I also made a referral to ENT.  She has always had voice hoarseness since her chemotherapy however I do think it is worsened and would warrant visualization of the vocal cords.  Patient does have a history of chronic cystitis and frequent UTIs.  She is having symptoms that in the past have represented a UTI.  I went ahead and empirically treated her with Macrobid and Diflucan for yeast.   Follow Up Instructions:    I discussed the assessment and treatment plan with the patient. The patient was provided an opportunity to ask  questions and all were answered. The patient agreed with the plan and demonstrated an understanding of the instructions.   The patient was advised to call back or seek an in-person evaluation if the symptoms worsen or if the condition fails to improve as anticipated.   Iran Planas, PA-C

## 2020-09-22 NOTE — Telephone Encounter (Signed)
FYI visit scheduled

## 2020-09-23 ENCOUNTER — Encounter: Payer: Self-pay | Admitting: Physician Assistant

## 2020-09-25 ENCOUNTER — Encounter: Payer: Self-pay | Admitting: Physician Assistant

## 2020-09-26 ENCOUNTER — Encounter: Payer: Self-pay | Admitting: Physician Assistant

## 2020-09-26 ENCOUNTER — Ambulatory Visit: Payer: BC Managed Care – PPO | Admitting: Physician Assistant

## 2020-09-26 ENCOUNTER — Ambulatory Visit (INDEPENDENT_AMBULATORY_CARE_PROVIDER_SITE_OTHER): Payer: BC Managed Care – PPO

## 2020-09-26 ENCOUNTER — Other Ambulatory Visit: Payer: Self-pay

## 2020-09-26 VITALS — BP 145/79 | HR 120 | Wt 204.2 lb

## 2020-09-26 DIAGNOSIS — M7061 Trochanteric bursitis, right hip: Secondary | ICD-10-CM | POA: Diagnosis not present

## 2020-09-26 DIAGNOSIS — M25551 Pain in right hip: Secondary | ICD-10-CM | POA: Diagnosis not present

## 2020-09-26 DIAGNOSIS — M545 Low back pain, unspecified: Secondary | ICD-10-CM

## 2020-09-26 MED ORDER — KETOROLAC TROMETHAMINE 60 MG/2ML IM SOLN
60.0000 mg | Freq: Once | INTRAMUSCULAR | Status: AC
  Administered 2020-09-26: 60 mg via INTRAMUSCULAR

## 2020-09-26 MED ORDER — TRAMADOL HCL 50 MG PO TABS: 50.0000 mg | ORAL_TABLET | Freq: Four times a day (QID) | ORAL | 0 refills | Status: AC | PRN

## 2020-09-26 MED ORDER — MELOXICAM 15 MG PO TABS: 15.0000 mg | ORAL_TABLET | Freq: Every day | ORAL | 0 refills | Status: DC

## 2020-09-26 NOTE — Progress Notes (Signed)
Subjective:    Patient ID: Tina Mcgrath, female    DOB: 1956/07/12, 65 y.o.   MRN: [id number]  HPI  Patient is a 65 year old obese female with history of osteoarthritis and  hip flexor tendinitis who presents to the clinic with 2 days of severe low back pain worse on the right and radiating into the lateral leg down to the knee. she has had bilateral hip replacements.  Patient denies any injury.  Symptoms started kind of suddenly.  She denies any bowel or bladder dysfunction, leg weakness, saddle anesthesia.  She denies any new activity.  She has been sitting more than usual.  She is taking Tylenol occasionally which helps some she has avoided ibuprofen in the past.  Patient would rate the pain 7 out of 10 currently.  .. Active Ambulatory Problems    Diagnosis Date Noted  . Hyperlipidemia 08/19/2014  . Elevated vitamin B12 level 08/19/2014  . Hematuria 08/19/2014  . Primary osteoarthritis of left knee 08/19/2014  . Dry eyes 08/19/2014  . Migraine without aura and with status migrainosus, not intractable 08/19/2014  . Mild sleep apnea 08/19/2014  . Fecal incontinence 08/19/2014  . Elevated triglycerides with high cholesterol 09/03/2014  . Controlled diabetes mellitus type II without complication ([place]) 58/05/9832  . Proteinuria 09/10/2014  . Elevated blood pressure 11/13/2014  . Chronic cystitis 01/06/2015  . Vitamin D deficiency 08/27/2015  . History of right breast cancer 11/04/2015  . Class 2 severe obesity due to excess calories with serious comorbidity and body mass index (BMI) of 39.0 to 39.9 in adult ([place]) 03/02/2016  . Cough 03/02/2016  . Carpal tunnel syndrome on both sides 08/23/2016  . Ulnar neuropathy of both upper extremities 08/23/2016  . Glaucoma suspect of both eyes 08/25/2016  . Elevated liver enzymes 11/28/2016  . Onychomycosis 11/28/2016  . Fatty liver disease, nonalcoholic 82/50/5397  . Hyperlipidemia associated with type 2 diabetes mellitus ([place]) 12/20/2017  .  Osteoarthritis of right hip 03/23/2018  . Trouble in sleeping 06/28/2018  . Grief reaction 06/28/2018  . Hip flexor tendinitis, right 06/28/2018  . Statin intolerance 06/29/2018  . Gastroesophageal reflux disease 11/14/2019  . Family history of esophageal cancer 12/12/2019  . Status post cataract extraction 02/12/2020  . Status post right hip replacement 02/12/2020   Resolved Ambulatory Problems    Diagnosis Date Noted  . Impaired fasting glucose 08/19/2014  . Breast cancer ([place]) 11/13/2014  . Post-viral cough syndrome 11/04/2015  . Prediabetes 11/21/2015  . Right groin pain 03/23/2018   Past Medical History:  Diagnosis Date  . Cancer ([place])   . History of cancer of right breast 11/04/2015  . HSV-2 (herpes simplex virus 2) infection   . Migraines   . Obesity 03/02/2016  . Osteoarthritis   . Rosacea   . Sleep apnea     Review of Systems    see HPI.  Objective:   Physical Exam Vitals reviewed.  Constitutional:      Appearance: Normal appearance. She is obese.  Cardiovascular:     Rate and Rhythm: Normal rate and regular rhythm.     Pulses: Normal pulses.  Musculoskeletal:     Right lower leg: No edema.     Left lower leg: No edema.     Comments: Decreased ROM at waist and right hip.  Tightness in low back and right buttocks with SLR of both legs but no radicular pain.  NROM of hips. Pain with right external ROM.  Tenderness over right greater trochanter.  Strength  lower legs 5/5.  Patellar reflexes 2+.  No spinal lumbar tenderness.  Tenderness parapsinal muscles and into right buttocks.   Neurological:     General: No focal deficit present.     Mental Status: She is alert and oriented to person, place, and time.  Psychiatric:     Comments: In active pain.           Assessment & Plan:  [name] KitchenMarland KitchenDiagnoses and all orders for this visit:  Acute right-sided low back pain without sciatica -     DG Lumbar Spine Complete -     traMADol (ULTRAM) 50 MG tablet; Take 1  tablet (50 mg total) by mouth every 6 (six) hours as needed for up to 5 days. -     meloxicam (MOBIC) 15 MG tablet; Take 1 tablet (15 mg total) by mouth daily. -     DG Hip Unilat W OR W/O Pelvis 2-3 Views Right -     ketorolac (TORADOL) injection 60 mg  Right hip pain -     traMADol (ULTRAM) 50 MG tablet; Take 1 tablet (50 mg total) by mouth every 6 (six) hours as needed for up to 5 days. -     meloxicam (MOBIC) 15 MG tablet; Take 1 tablet (15 mg total) by mouth daily. -     DG Hip Unilat W OR W/O Pelvis 2-3 Views Right -     ketorolac (TORADOL) injection 60 mg  Greater trochanteric bursitis of right hip -     traMADol (ULTRAM) 50 MG tablet; Take 1 tablet (50 mg total) by mouth every 6 (six) hours as needed for up to 5 days. -     meloxicam (MOBIC) 15 MG tablet; Take 1 tablet (15 mg total) by mouth daily. -     DG Hip Unilat W OR W/O Pelvis 2-3 Views Right -     ketorolac (TORADOL) injection 60 mg   Bursa Injection Procedure Note  Pre-operative Diagnosis: right Trochanteric bursitis  Post-operative Diagnosis: same  Indications: Diagnosis and treatment of symptomatic bursal effusion  Anesthesia:ethyl chloride spray  Procedure Details   After a discussion of the risks and benefits with the patient (including the possibility that any manipulation of the bursa could introduce infection, worsening the current situation significantly), verbal consent was obtained for the procedure. The joint was prepped with alcohol swab x3. An 18 gauge needle was introduced  Depo medrol 40mg  and lidocaine 2 percent 48mL The injection site was cleansed with topical isopropyl alcohol and a dressing was applied.  Complications:  Tolerated well. Pain decreased from 7 to 4 within 10 minutes of injection.   I suspect pain is mostly coming from her greater trochanter since she had so much tenderness over the site.  The injection in the office today improved her pain from a 712 4.  That is reassuring.  Discussed  symptomatic care and icing.  Discussed rest and stretching.  Handout given.  Would like for patient to trial meloxicam in the morning for the next 1 to 2 weeks. Watch for GI upset. Checked renal function and no concerns with adding NSAIDs. Toradol injection was given in office today.  We will get lumbar and hip x-rays today.  No red flags signs or symptoms of back pain. Tramadol given for break through pain.   Follow up in 2 weeks.   [name] KitchenMarland KitchenPDMP reviewed during this encounter.

## 2020-09-26 NOTE — Patient Instructions (Signed)
Hip Bursitis Rehab Ask your health care provider which exercises are safe for you. Do exercises exactly as told by your health care provider and adjust them as directed. It is normal to feel mild stretching, pulling, tightness, or discomfort as you do these exercises. Stop right away if you feel sudden pain or your pain gets worse. Do not begin these exercises until told by your health care provider. Stretching exercise This exercise warms up your muscles and joints and improves the movement and flexibility of your hip. This exercise also helps to relieve pain and stiffness. Iliotibial band stretch An iliotibial band is a strong band of muscle tissue that runs from the outer side of your hip to the outer side of your thigh and knee. 1. Lie on your side with your left / right leg in the top position. 2. Bend your left / right knee and grab your ankle. Stretch out your bottom arm to help you balance. 3. Slowly bring your knee back so your thigh is behind your body. 4. Slowly lower your knee toward the floor until you feel a gentle stretch on the outside of your left / right thigh. If you do not feel a stretch and your knee will not fall farther, place the heel of your other foot on top of your knee and pull your knee down toward the floor with your foot. 5. Hold this position for __________ seconds. 6. Slowly return to the starting position. Repeat __________ times. Complete this exercise __________ times a day.   Strengthening exercises These exercises build strength and endurance in your hip and pelvis. Endurance is the ability to use your muscles for a long time, even after they get tired. Bridge This exercise strengthens the muscles that move your thigh backward (hip extensors). 1. Lie on your back on a firm surface with your knees bent and your feet flat on the floor. 2. Tighten your buttocks muscles and lift your buttocks off the floor until your trunk is level with your thighs. ? Do not arch  your back. ? You should feel the muscles working in your buttocks and the back of your thighs. If you do not feel these muscles, slide your feet 1-2 inches (2.5-5 cm) farther away from your buttocks. ? If this exercise is too easy, try doing it with your arms crossed over your chest. 3. Hold this position for __________ seconds. 4. Slowly lower your hips to the starting position. 5. Let your muscles relax completely after each repetition. Repeat __________ times. Complete this exercise __________ times a day.   Squats This exercise strengthens the muscles in front of your thigh and knee (quadriceps). 1. Stand in front of a table, with your feet and knees pointing straight ahead. You may rest your hands on the table for balance but not for support. 2. Slowly bend your knees and lower your hips like you are going to sit in a chair. ? Keep your weight over your heels, not over your toes. ? Keep your lower legs upright so they are parallel with the table legs. ? Do not let your hips go lower than your knees. ? Do not bend lower than told by your health care provider. ? If your hip pain increases, do not bend as low. 3. Hold the squat position for __________ seconds. 4. Slowly push with your legs to return to standing. Do not use your hands to pull yourself to standing. Repeat __________ times. Complete this exercise __________ times a day.  Hip hike 1. Stand sideways on a bottom step. Stand on your left / right leg with your other foot unsupported next to the step. You can hold on to the railing or wall for balance if needed. 2. Keep your knees straight and your torso square. Then lift your left / right hip up toward the ceiling. 3. Hold this position for __________ seconds. 4. Slowly let your left / right hip lower toward the floor, past the starting position. Your foot should get closer to the floor. Do not lean or bend your knees. Repeat __________ times. Complete this exercise __________ times  a day. Single leg stand 1. Without shoes, stand near a railing or in a doorway. You may hold on to the railing or door frame as needed for balance. 2. Squeeze your left / right buttock muscles, then lift up your other foot. ? Do not let your left / right hip push out to the side. ? It is helpful to stand in front of a mirror for this exercise so you can watch your hip. 3. Hold this position for __________ seconds. Repeat __________ times. Complete this exercise __________ times a day. This information is not intended to replace advice given to you by your health care provider. Make sure you discuss any questions you have with your health care provider. Document Revised: 12/18/2018 Document Reviewed: 12/18/2018 Elsevier Patient Education  2021 [employer]  

## 2020-09-27 NOTE — Progress Notes (Signed)
Mild degenerative changes in low back. Some arthritis disc and facets. Treatment plan the same.

## 2020-09-27 NOTE — Progress Notes (Signed)
No acute changes in hip.

## 2020-09-29 ENCOUNTER — Other Ambulatory Visit: Payer: Self-pay | Admitting: Physician Assistant

## 2020-09-29 ENCOUNTER — Encounter: Payer: Self-pay | Admitting: Physician Assistant

## 2020-09-29 DIAGNOSIS — M25551 Pain in right hip: Secondary | ICD-10-CM

## 2020-09-29 DIAGNOSIS — M545 Low back pain, unspecified: Secondary | ICD-10-CM

## 2020-09-29 DIAGNOSIS — M7061 Trochanteric bursitis, right hip: Secondary | ICD-10-CM

## 2020-09-30 MED ORDER — GABAPENTIN 100 MG PO CAPS
100.0000 mg | ORAL_CAPSULE | Freq: Three times a day (TID) | ORAL | 0 refills | Status: DC
Start: 2020-09-30 — End: 2020-10-24

## 2020-10-03 ENCOUNTER — Encounter: Payer: Self-pay | Admitting: Physician Assistant

## 2020-10-07 NOTE — Telephone Encounter (Signed)
Ok to call patient and let her know that we don't have enough covid swabs to test if she is not symptomatic.

## 2020-10-08 ENCOUNTER — Other Ambulatory Visit: Payer: Self-pay

## 2020-10-08 ENCOUNTER — Ambulatory Visit (INDEPENDENT_AMBULATORY_CARE_PROVIDER_SITE_OTHER): Payer: BC Managed Care – PPO | Admitting: Otolaryngology

## 2020-10-08 ENCOUNTER — Encounter (INDEPENDENT_AMBULATORY_CARE_PROVIDER_SITE_OTHER): Payer: Self-pay | Admitting: Otolaryngology

## 2020-10-08 VITALS — Temp 97.9°F

## 2020-10-08 DIAGNOSIS — J310 Chronic rhinitis: Secondary | ICD-10-CM

## 2020-10-08 DIAGNOSIS — R490 Dysphonia: Secondary | ICD-10-CM | POA: Diagnosis not present

## 2020-10-08 DIAGNOSIS — K219 Gastro-esophageal reflux disease without esophagitis: Secondary | ICD-10-CM

## 2020-10-08 DIAGNOSIS — R053 Chronic cough: Secondary | ICD-10-CM

## 2020-10-08 NOTE — Progress Notes (Signed)
HPI: Nayra Coury is a 65 y.o. female who presents is referred by her PCP for evaluation of chronic cough that she has had on and off for over 4 months.  She just recently moved here from [place].  While in [place] she was treated for breast cancer with chemotherapy and an experimental drug.  She had problems with hoarseness and had a biopsy of the vocal cords performed in [place].  She apparently also developed a small perforation in her septum following treatment.  She has occasional bleeding from her nose. She also has history of GERD and is on pantoprazole. She has had a long history of nasal sinus issues. She had a scan of her sinuses performed over 10 years ago in [place] but she does not know the results of this. Of note she had a right DCR performed in [place] over 20 years ago. She is presently on NeilMed saline rinses as well as nasal gel for the crusting in the nose.  She has also been using Atrovent nasal spray..  Past Medical History:  Diagnosis Date  . Cancer ([place])   . Dry eyes   . History of cancer of right breast 11/04/2015  . HSV-2 (herpes simplex virus 2) infection   . Migraines   . Obesity 03/02/2016  . Osteoarthritis   . Rosacea   . Sleep apnea    Past Surgical History:  Procedure Laterality Date  . BREAST SURGERY Right 09/07/2007  . CATARACT EXTRACTION Left 10/18/2019  . JOINT REPLACEMENT Right    hip  . LASIK    . TOTAL HIP ARTHROPLASTY Left    Social History   Socioeconomic History  . Marital status: Soil scientist    Spouse name: Not on file  . Number of children: Not on file  . Years of education: Not on file  . Highest education level: Not on file  Occupational History  . Not on file  Tobacco Use  . Smoking status: Former Smoker    Packs/day: 0.25    Years: 10.00    Pack years: 2.50    Quit date: 1995    Years since quitting: 27.1  . Smokeless tobacco: Never Used  Substance and Sexual Activity  . Alcohol use: No    Alcohol/week: 0.0 standard  drinks  . Drug use: No  . Sexual activity: Never  Other Topics Concern  . Not on file  Social History Narrative  . Not on file   Social Determinants of Health   Financial Resource Strain: Not on file  Food Insecurity: Not on file  Transportation Needs: Not on file  Physical Activity: Not on file  Stress: Not on file  Social Connections: Not on file   Family History  Problem Relation Age of Onset  . Hyperlipidemia Mother   . Hypertension Mother   . Heart attack Father   . Hypertension Father   . Hyperlipidemia Father   . Diabetes Father   . Cancer Father        prostate and colon  . Alcohol abuse Brother   . Cancer Maternal Uncle        esophageal  . Cancer Paternal Aunt        breast  . Macular degeneration Maternal Grandmother   . Arthritis Maternal Grandmother   . Cancer Maternal Grandfather        multiple myeloma, bone marrow  . Stroke Paternal Grandmother   . Hypertension Paternal Grandmother   . Diabetes Paternal Grandmother   . Osteoporosis Paternal  Grandmother   . Heart attack Paternal Grandmother   . Hypertension Paternal Grandfather    Allergies  Allergen Reactions  . Sulfa Antibiotics Hives  . Doxycycline Hyclate Nausea And Vomiting  . Lisinopril     Dizziness.  . Adhesive [Tape] Itching and Rash  . Augmentin [Amoxicillin-Pot Clavulanate] Other (See Comments)    GI upset  . Ciprofloxacin Itching and Rash  . Crestor [Rosuvastatin]     Muscle cramps  . Femara [Letrozole]     Muscle pain and body froze up.   . Lipitor [Atorvastatin]     Muscle cramps  . Livalo [Pitavastatin]     Muscle cramps  . Zocor [Simvastatin]     Cramps/muscle cramps    Prior to Admission medications   Medication Sig Start Date End Date Taking? Authorizing Provider  Acetaminophen (TYLENOL EXTRA STRENGTH PO) Take by mouth as needed.    [provider]  albuterol (VENTOLIN HFA) 108 (90 Base) MCG/ACT inhaler INHALE 2 PUFFS INTO THE LUNGS EVERY 6 HOURS AS NEEDED  09/10/20   Breeback, Jade L, PA-C  Alirocumab (PRALUENT) 75 MG/ML SOAJ Inject 75 mg into the skin every 14 (fourteen) days. 05/23/20   Breeback, Royetta Car, PA-C  Cholecalciferol (VITAMIN D3) 5000 units TABS Take 5,000 Units by mouth 2 (two) times a day.     [provider]  Coenzyme Q10 (COQ10) 100 MG CAPS Take by mouth daily.    [provider]  ezetimibe (ZETIA) 10 MG tablet Take 1 tablet (10 mg total) by mouth daily. 11/14/19   Breeback, Jade L, PA-C  fexofenadine (ALLEGRA) 180 MG tablet Take 180 mg by mouth daily. 11/14/19   [provider]  gabapentin (NEURONTIN) 100 MG capsule Take 1 capsule (100 mg total) by mouth 3 (three) times daily. 09/30/20   Breeback, Jade L, PA-C  ipratropium (ATROVENT) 0.03 % nasal spray Place 2 sprays into both nostrils every 12 (twelve) hours. 09/22/20   Breeback, Royetta Car, PA-C  meloxicam (MOBIC) 15 MG tablet Take 1 tablet (15 mg total) by mouth daily. 09/26/20   Breeback, Jade L, PA-C  montelukast (SINGULAIR) 10 MG tablet Take 1 tablet (10 mg total) by mouth at bedtime. 09/22/20   Breeback, Royetta Car, PA-C  nitrofurantoin, macrocrystal-monohydrate, (MACROBID) 100 MG capsule Take 1 capsule (100 mg total) by mouth 2 (two) times daily. 09/22/20   Breeback, Royetta Car, PA-C  Omega-3 Krill Oil 500 MG CAPS Take 1,000 mg by mouth 2 (two) times daily.     [provider]  pantoprazole (PROTONIX) 40 MG tablet Take 1 tablet (40 mg total) by mouth 2 (two) times daily. 09/18/20   Breeback, Jade L, PA-C  Semaglutide, 1 MG/DOSE, (OZEMPIC, 1 MG/DOSE,) 2 MG/1.5ML SOPN Inject 1 mg into the skin once a week. 08/18/20   Breeback, Royetta Car, PA-C  SUMAtriptan (IMITREX) 100 MG tablet Take 1 tablet (100 mg total) by mouth as needed for migraine or headache. May repeat in 2 hours if headache persists or recurs. 07/10/19   Breeback, Jade L, PA-C  valACYclovir (VALTREX) 500 MG tablet TAKE 1 TABLET BY MOUTH DAILY AS NEEDED 11/27/19   Breeback, Jade L, PA-C     Positive ROS:  Otherwise negative  All other systems have been reviewed and were otherwise negative with the exception of those mentioned in the HPI and as above.  Physical Exam: Constitutional: Alert, well-appearing, no acute distress Ears: External ears without lesions or tenderness. Ear canals are clear bilaterally with intact, clear TMs.  Nasal:  External nose without lesions. Septum is slightly deviated to the right.  Of note she has mild scabbing and crusting along the right side of septum and she has a small inferior septal perforation..  On nasal endoscopy the middle meatus regions are clear there are no polyps no gross mucopurulent discharge noted.  The mucus within the nasal cavity is clear.  The nasopharynx is clear. Oral: Lips and gums without lesions. Tongue and palate mucosa without lesions. Posterior oropharynx clear.  She has clear mucus discharge in the posterior oropharynx. Fiberoptic laryngoscopy was performed and on fiberoptic laryngoscopy the nasopharynx is clear.  The base of tongue is clear as is the epiglottis.  Vocal cords were clear bilaterally with normal vocal mobility.  She did have a moderate amount of clear mucus discharge in the supraglottic area.  She has mild arytenoid edema consistent with reflux problems. Neck: No palpable adenopathy or masses Respiratory: Breathing comfortably  Skin: No facial/neck lesions or rash noted.  Laryngoscopy  Date/Time: 10/08/2020 1:46 PM Performed by: Rozetta Nunnery, MD Authorized by: Rozetta Nunnery, MD   Consent:    Consent obtained:  Verbal   Consent given by:  Patient Procedure details:    Indications: direct visualization of the upper aerodigestive tract and hoarseness, dysphagia, or aspiration     Instrument: flexible fiberoptic laryngoscope     Scope location: bilateral nare   Sinus:    Right middle meatus: normal     Left middle meatus: normal     Right nasopharynx: normal     Left nasopharynx: normal   Mouth:     Oropharynx: normal     Vallecula: normal     Base of tongue: normal     Epiglottis: normal   Throat:    True vocal cords: normal   Comments:     On nasal endoscopy and fiberoptic laryngoscopy she had moderate clear mucus discharge but no signs of active infection.  She had moderate supraglottic mucus and mild edema of the arytenoid mucosa but no vocal cord lesions noted.  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    Assessment: History of hoarseness with normal laryngeal examination. History of GERD with patient presently on pantoprazole. Chronic rhinitis.  On clinical exam today there is no evidence of active infection. Chronic cough  Plan: Recommended regular use of nasal steroid saline spray and suggested trying Xlear saline spray as this may be a little bit better than the NeilMed spray. Suggest taking a break from the Atrovent nasal spray as this helps dry the nose but on exam presently she has dry mucous membranes and crusting in the nasal cavity. She will continue with use of the nasal gel to help with the crusting and scabbing in the nose. Also recommended continue use of the pantoprazole as she does have some indication of reflux symptoms. Briefly discussed with her concerning use of Mucinex but she states that she does not tolerate guaifenesin well. Suggested drinking plenty of liquids as well as warm tea with honey should help with the throat symptoms and cough. If cough symptoms do not improve over the next month she will call us back to schedule CT scan of the sinuses for better evaluation of the sinuses but presently no clinical evidence of active infection requiring further antibiotic therapy.   Radene Journey, MD   CC:

## 2020-10-24 ENCOUNTER — Encounter: Payer: Self-pay | Admitting: Physician Assistant

## 2020-10-24 MED ORDER — FLUCONAZOLE 150 MG PO TABS: 150.0000 mg | ORAL_TABLET | Freq: Once | ORAL | 0 refills | Status: AC

## 2020-10-24 MED ORDER — GABAPENTIN 100 MG PO CAPS: 100.0000 mg | ORAL_CAPSULE | Freq: Three times a day (TID) | ORAL | 1 refills | Status: DC

## 2020-10-24 NOTE — Addendum Note (Signed)
Addended by: Donella Stade on: 10/24/2020 02:34 PM   Modules accepted: Orders

## 2020-10-27 ENCOUNTER — Other Ambulatory Visit: Payer: Self-pay | Admitting: Physician Assistant

## 2020-11-03 ENCOUNTER — Other Ambulatory Visit: Payer: Self-pay | Admitting: Physician Assistant

## 2020-11-03 DIAGNOSIS — E785 Hyperlipidemia, unspecified: Secondary | ICD-10-CM

## 2020-11-04 MED ORDER — FLUCONAZOLE 150 MG PO TABS
ORAL_TABLET | ORAL | 0 refills | Status: DC
Start: 2020-11-04 — End: 2020-11-17

## 2020-11-04 NOTE — Addendum Note (Signed)
Addended by: Donella Stade on: 11/04/2020 08:35 AM   Modules accepted: Orders

## 2020-11-09 ENCOUNTER — Other Ambulatory Visit: Payer: Self-pay | Admitting: Physician Assistant

## 2020-11-09 DIAGNOSIS — E1169 Type 2 diabetes mellitus with other specified complication: Secondary | ICD-10-CM

## 2020-11-17 ENCOUNTER — Other Ambulatory Visit: Payer: Self-pay

## 2020-11-17 ENCOUNTER — Ambulatory Visit (INDEPENDENT_AMBULATORY_CARE_PROVIDER_SITE_OTHER): Payer: BC Managed Care – PPO | Admitting: Physician Assistant

## 2020-11-17 VITALS — BP 140/73 | HR 110 | Ht 62.5 in | Wt 191.0 lb

## 2020-11-17 DIAGNOSIS — R252 Cramp and spasm: Secondary | ICD-10-CM

## 2020-11-17 DIAGNOSIS — E119 Type 2 diabetes mellitus without complications: Secondary | ICD-10-CM

## 2020-11-17 DIAGNOSIS — E1169 Type 2 diabetes mellitus with other specified complication: Secondary | ICD-10-CM

## 2020-11-17 DIAGNOSIS — E785 Hyperlipidemia, unspecified: Secondary | ICD-10-CM

## 2020-11-17 DIAGNOSIS — F4321 Adjustment disorder with depressed mood: Secondary | ICD-10-CM

## 2020-11-17 DIAGNOSIS — E1165 Type 2 diabetes mellitus with hyperglycemia: Secondary | ICD-10-CM | POA: Diagnosis not present

## 2020-11-17 DIAGNOSIS — K219 Gastro-esophageal reflux disease without esophagitis: Secondary | ICD-10-CM

## 2020-11-17 LAB — POCT GLYCOSYLATED HEMOGLOBIN (HGB A1C): Hemoglobin A1C: 12.8 % — AB (ref 4.0–5.6)

## 2020-11-17 MED ORDER — PANTOPRAZOLE SODIUM 40 MG PO TBEC: 40.0000 mg | DELAYED_RELEASE_TABLET | Freq: Two times a day (BID) | ORAL | 3 refills | Status: DC

## 2020-11-17 MED ORDER — OZEMPIC (1 MG/DOSE) 4 MG/3ML ~~LOC~~ SOPN: PEN_INJECTOR | SUBCUTANEOUS | 2 refills | Status: DC

## 2020-11-17 MED ORDER — EZETIMIBE 10 MG PO TABS: 10.0000 mg | ORAL_TABLET | Freq: Every day | ORAL | 3 refills | Status: DC

## 2020-11-17 MED ORDER — PRALUENT 75 MG/ML ~~LOC~~ SOAJ: SUBCUTANEOUS | 11 refills | Status: DC

## 2020-11-17 NOTE — Patient Instructions (Signed)
Leg Cramps Leg cramps occur when one or more muscles tighten and a person has no control over it (involuntary muscle contraction). Muscle cramps are most common in the calf muscles of the leg. They can occur during exercise or at rest. Leg cramps are painful, and they may last for a few seconds to a few minutes. Cramps may return several times before they finally stop. Usually, leg cramps are not caused by a serious medical problem. In many cases, the cause is not known. Some common causes include:  Excessive physical effort (overexertion), such as during intense exercise.  Doing the same motion over and over.  Staying in a certain position for a long period of time.  Improper preparation, form, or technique while doing a sport or an activity.  Dehydration.  Injury.  Side effects of certain medicines.  Abnormally low levels of minerals in your blood (electrolytes), especially potassium and calcium. This could result from: ? Pregnancy. ? Taking diuretic medicines. Follow these instructions at home: Eating and drinking  Drink enough fluid to keep your urine pale yellow. Staying hydrated may help prevent cramps.  Eat a healthy diet that includes plenty of nutrients to help your muscles function. A healthy diet includes fruits and vegetables, lean protein, whole grains, and low-fat or nonfat dairy products. Managing pain, stiffness, and swelling  Try massaging, stretching, and relaxing the affected muscle. Do this for several minutes at a time.  If directed, put ice on areas that are sore or painful after a cramp. To do this: ? Put ice in a plastic bag. ? Place a towel between your skin and the bag. ? Leave the ice on for 20 minutes, 2-3 times a day. ? Remove the ice if your skin turns bright red. This is very important. If you cannot feel pain, heat, or cold, you have a greater risk of damage to the area.  If directed, apply heat to muscles that are tense or tight. Do this before  you exercise, or as often as told by your health care provider. Use the heat source that your health care provider recommends, such as a moist heat pack or a heating pad. To do this: ? Place a towel between your skin and the heat source. ? Leave the heat on for 20-30 minutes. ? Remove the heat if your skin turns bright red. This is especially important if you are unable to feel pain, heat, or cold. You may have a greater risk of getting burned.  Try taking hot showers or baths to help relax tight muscles.      General instructions  If you are having frequent leg cramps, avoid intense exercise for several days.  Take over-the-counter and prescription medicines only as told by your health care provider.  Keep all follow-up visits. This is important. Contact a health care provider if:  Your leg cramps get more severe or more frequent, or they do not improve over time.  Your foot becomes cold, numb, or blue. Summary  Muscle cramps can develop in any muscle, but the most common place is in the calf muscles of the leg.  Leg cramps are painful, and they may last for a few seconds to a few minutes.  Usually, leg cramps are not caused by a serious medical problem. Often, the cause is not known.  Stay hydrated, and take over-the-counter and prescription medicines only as told by your health care provider. This information is not intended to replace advice given to you by your   health care provider. Make sure you discuss any questions you have with your health care provider. Document Revised: 01/09/2020 Document Reviewed: 01/09/2020 Elsevier Patient Education  2021 Elsevier Inc.  

## 2020-11-17 NOTE — Progress Notes (Signed)
To note A!C double. Will confirm with blood work.

## 2020-11-17 NOTE — Progress Notes (Signed)
Subjective:    Patient ID: Tina Mcgrath, female    DOB: [birth year], 65 y.o.   MRN: [id number]  HPI  Patient is a 65 year old obese female with type 2 diabetes, hyperlipidemia, GERD, OSA who presents to the clinic for medication follow-up.  Her husband did die of Covid about 4 weeks ago.  She is very sad, lonely, and shock still.  She denies any suicidal thoughts or homicidal idealizations.  She does have good supportive neighbors and she feels like she is okay.  Patient has not been checking her sugars at home for diabetes.  She has been taking her Ozempic.  She admits her diet has not been what it should be over the past few weeks.  She denies any hypoglycemic events.  She denies any increased thirst or urination.  No open sores or wounds.  Patient denies any chest pains, palpitations, headaches, vision changes.  Patient is having some leg cramps mostly at night.  She denies any pain in her legs during the day or when walking.  GERD is controlled.  She is on Protonix twice daily.  Her cough has resolved.  .. Active Ambulatory Problems    Diagnosis Date Noted  . Hyperlipidemia 08/19/2014  . Elevated vitamin B12 level 08/19/2014  . Hematuria 08/19/2014  . Primary osteoarthritis of left knee 08/19/2014  . Dry eyes 08/19/2014  . Migraine without aura and with status migrainosus, not intractable 08/19/2014  . Mild sleep apnea 08/19/2014  . Fecal incontinence 08/19/2014  . Elevated triglycerides with high cholesterol 09/03/2014  . Controlled diabetes mellitus type II without complication ([place]) 26/71/2458  . Proteinuria 09/10/2014  . Elevated blood pressure 11/13/2014  . Chronic cystitis 01/06/2015  . Vitamin D deficiency 08/27/2015  . History of right breast cancer 11/04/2015  . Class 2 severe obesity due to excess calories with serious comorbidity and body mass index (BMI) of 39.0 to 39.9 in adult ([place]) 03/02/2016  . Cough 03/02/2016  . Carpal tunnel syndrome on both sides 08/23/2016   . Ulnar neuropathy of both upper extremities 08/23/2016  . Glaucoma suspect of both eyes 08/25/2016  . Elevated liver enzymes 11/28/2016  . Onychomycosis 11/28/2016  . Fatty liver disease, nonalcoholic 09/98/3382  . Hyperlipidemia associated with type 2 diabetes mellitus ([place]) 12/20/2017  . Osteoarthritis of right hip 03/23/2018  . Trouble in sleeping 06/28/2018  . Grief 06/28/2018  . Hip flexor tendinitis, right 06/28/2018  . Statin intolerance 06/29/2018  . Gastroesophageal reflux disease 11/14/2019  . Family history of esophageal cancer 12/12/2019  . Status post cataract extraction 02/12/2020  . Status post right hip replacement 02/12/2020  . Leg cramps 11/19/2020   Resolved Ambulatory Problems    Diagnosis Date Noted  . Impaired fasting glucose 08/19/2014  . Breast cancer ([place]) 11/13/2014  . Post-viral cough syndrome 11/04/2015  . Prediabetes 11/21/2015  . Right groin pain 03/23/2018   Past Medical History:  Diagnosis Date  . Cancer ([place])   . History of cancer of right breast 11/04/2015  . HSV-2 (herpes simplex virus 2) infection   . Migraines   . Obesity 03/02/2016  . Osteoarthritis   . Rosacea   . Sleep apnea     Review of Systems  All other systems reviewed and are negative.      Objective:   Physical Exam Vitals reviewed.  Constitutional:      Appearance: Normal appearance. She is obese.  Cardiovascular:     Rate and Rhythm: Normal rate and regular rhythm.  Pulses: Normal pulses.     Heart sounds: Normal heart sounds.  Pulmonary:     Effort: Pulmonary effort is normal.  Neurological:     General: No focal deficit present.     Mental Status: She is alert and oriented to person, place, and time.  Psychiatric:     Comments: tearful       Lab Results  Component Value Date   HGBA1C 12.8 (A) 11/17/2020       Assessment & Plan:  [name] KitchenMarland KitchenBergen was seen today for diabetes.  Diagnoses and all orders for this visit:  Uncontrolled type 2 diabetes  mellitus with hyperglycemia (HCC) -     POCT glycosylated hemoglobin (Hb A1C) -     Hemoglobin A1c -     COMPLETE METABOLIC PANEL WITH GFR -     Semaglutide, 1 MG/DOSE, (OZEMPIC, 1 MG/DOSE,) 4 MG/3ML SOPN; INJECT 1MG  INTO THE SKIN ONCE WEEKLY  Hyperlipidemia, unspecified hyperlipidemia type -     ezetimibe (ZETIA) 10 MG tablet; Take 1 tablet (10 mg total) by mouth daily. -     Lipid Panel w/reflex Direct LDL  Hyperlipidemia associated with type 2 diabetes mellitus (HCC) -     COMPLETE METABOLIC PANEL WITH GFR -     Alirocumab (PRALUENT) 75 MG/ML SOAJ; INJECT 1 ML(75MG ) INTO THE SKIN EVERY 14 DAYS  Leg cramps -     Magnesium  Grief  Gastroesophageal reflux disease without esophagitis -     pantoprazole (PROTONIX) 40 MG tablet; Take 1 tablet (40 mg total) by mouth 2 (two) times daily.   A1c is not to goal today.  Patient does need labs and will confirm and blood work. If point-of-care A1c is right A1c has doubled in 3 months. For now continue Ozempic. We will call with future med changes. BP not to goal today. Pt will start checking at home. Continue on Praulent and zetia. Statin intolerant.  Declined covid/pneumonia/flu vaccines.  Will get copy of eye exam.  UTD foot exam.  Follow up in 10months.   Patient is having some leg cramps.  This could be due to her high sugars.  For now start magnesium supplement daily.  Gave handout on conservative treatment for leg cramps.  Make sure she is drinking enough water.  Consider massage of calf before bedtime.  Refilled protonix.  Discussed grief.  Spent some time discussing some healthy ways to process this harder time.  Strongly encouraged her not to isolate.  Follow-up as needed.  Unfortunately she is accustomed to grief.  She lost her son just a few years ago.

## 2020-11-18 LAB — COMPLETE METABOLIC PANEL WITH GFR
AG Ratio: 2.1 (calc) (ref 1.0–2.5)
ALT: 39 U/L — ABNORMAL HIGH (ref 6–29)
AST: 27 U/L (ref 10–35)
Albumin: 4.4 g/dL (ref 3.6–5.1)
Alkaline phosphatase (APISO): 67 U/L (ref 37–153)
BUN: 7 mg/dL (ref 7–25)
CO2: 27 mmol/L (ref 20–32)
Calcium: 9.6 mg/dL (ref 8.6–10.4)
Chloride: 101 mmol/L (ref 98–110)
Creat: 0.64 mg/dL (ref 0.50–0.99)
GFR, Est African American: 111 mL/min/{1.73_m2} (ref >=60)
GFR, Est Non African American: 96 mL/min/{1.73_m2} (ref >=60)
Globulin: 2.1 g/dL (calc) (ref 1.9–3.7)
Glucose, Bld: 285 mg/dL — ABNORMAL HIGH (ref 65–99)
Potassium: 4.3 mmol/L (ref 3.5–5.3)
Sodium: 139 mmol/L (ref 135–146)
Total Bilirubin: 0.9 mg/dL (ref 0.2–1.2)
Total Protein: 6.5 g/dL (ref 6.1–8.1)

## 2020-11-18 LAB — LIPID PANEL W/REFLEX DIRECT LDL
Cholesterol: 112 mg/dL (ref <200)
HDL: 63 mg/dL (ref >=50)
LDL Cholesterol (Calc): 25 mg/dL (calc)
Non-HDL Cholesterol (Calc): 49 mg/dL (calc) (ref <130)
Total CHOL/HDL Ratio: 1.8 (calc) (ref <5.0)
Triglycerides: 163 mg/dL — ABNORMAL HIGH (ref <150)

## 2020-11-18 LAB — MAGNESIUM: Magnesium: 2 mg/dL (ref 1.5–2.5)

## 2020-11-18 LAB — HEMOGLOBIN A1C
Hgb A1c MFr Bld: 12.4 % of total Hgb — ABNORMAL HIGH (ref <5.7)
Mean Plasma Glucose: 309 mg/dL
eAG (mmol/L): 17.1 mmol/L

## 2020-11-18 NOTE — Progress Notes (Signed)
Telephone call tomorrow. You do not have to call her before. I will call.

## 2020-11-18 NOTE — Progress Notes (Signed)
Shenicka,   Magnesium level fine.  Potassium good.  Sodium good.  Your sugars are very high.  Your A1C was confirmed 12.8.  This is unfortunately insulin range. I would typically start someone on night time insulin with ozempic at this point. We could also add another oral and see how it brings sugars down with diet in hopes of decreasing night time insulin. Would you like a phone call to discuss our next steps?

## 2020-11-19 ENCOUNTER — Telehealth (INDEPENDENT_AMBULATORY_CARE_PROVIDER_SITE_OTHER): Payer: BC Managed Care – PPO | Admitting: Physician Assistant

## 2020-11-19 ENCOUNTER — Encounter: Payer: Self-pay | Admitting: Physician Assistant

## 2020-11-19 VITALS — Ht 62.5 in | Wt 191.0 lb

## 2020-11-19 DIAGNOSIS — E1165 Type 2 diabetes mellitus with hyperglycemia: Secondary | ICD-10-CM

## 2020-11-19 DIAGNOSIS — R252 Cramp and spasm: Secondary | ICD-10-CM | POA: Insufficient documentation

## 2020-11-19 MED ORDER — TRESIBA FLEXTOUCH 100 UNIT/ML ~~LOC~~ SOPN: 10.0000 [IU] | PEN_INJECTOR | Freq: Every day | SUBCUTANEOUS | 2 refills | Status: DC

## 2020-11-19 NOTE — Progress Notes (Signed)
Patient ID: Tina Mcgrath, female   DOB: [birth year], 65 y.o.   MRN: [id number] .[name] KitchenVirtual Visit via Video Note  I connected with Tina Mcgrath on 11/19/20 at  1:00 PM EDT by a video enabled telemedicine application and verified that I am speaking with the correct person using two identifiers.  Location: Patient: home Provider: clinic  .[name] KitchenParticipating in visit:  Patient: Tina Mcgrath Provider: Iran Planas PA-C   I discussed the limitations of evaluation and management by telemedicine and the availability of in person appointments. The patient expressed understanding and agreed to proceed.  History of Present Illness: Patient is a 65 year old female with uncontrolled type 2 diabetes who comes in to discuss medication regimen.  Last A1c was around 6 and now confirmed via blood work A1c was 12.4.  She denies any hypoglycemia symptoms. She is experiencing a lot of grief with loss of her husband and now her brother is missing.  Perhaps she is not able to feel her body symptoms because she still in shock.  .. Active Ambulatory Problems    Diagnosis Date Noted  . Hyperlipidemia 08/19/2014  . Elevated vitamin B12 level 08/19/2014  . Hematuria 08/19/2014  . Primary osteoarthritis of left knee 08/19/2014  . Dry eyes 08/19/2014  . Migraine without aura and with status migrainosus, not intractable 08/19/2014  . Mild sleep apnea 08/19/2014  . Fecal incontinence 08/19/2014  . Elevated triglycerides with high cholesterol 09/03/2014  . Controlled diabetes mellitus type II without complication ([place]) 48/25/0037  . Proteinuria 09/10/2014  . Elevated blood pressure 11/13/2014  . Chronic cystitis 01/06/2015  . Vitamin D deficiency 08/27/2015  . History of right breast cancer 11/04/2015  . Class 2 severe obesity due to excess calories with serious comorbidity and body mass index (BMI) of 39.0 to 39.9 in adult ([place]) 03/02/2016  . Cough 03/02/2016  . Carpal tunnel syndrome on both sides 08/23/2016  . Ulnar  neuropathy of both upper extremities 08/23/2016  . Glaucoma suspect of both eyes 08/25/2016  . Elevated liver enzymes 11/28/2016  . Onychomycosis 11/28/2016  . Fatty liver disease, nonalcoholic 04/88/8916  . Hyperlipidemia associated with type 2 diabetes mellitus ([place]) 12/20/2017  . Osteoarthritis of right hip 03/23/2018  . Trouble in sleeping 06/28/2018  . Grief 06/28/2018  . Hip flexor tendinitis, right 06/28/2018  . Statin intolerance 06/29/2018  . Gastroesophageal reflux disease 11/14/2019  . Family history of esophageal cancer 12/12/2019  . Status post cataract extraction 02/12/2020  . Status post right hip replacement 02/12/2020  . Leg cramps 11/19/2020   Resolved Ambulatory Problems    Diagnosis Date Noted  . Impaired fasting glucose 08/19/2014  . Breast cancer ([place]) 11/13/2014  . Post-viral cough syndrome 11/04/2015  . Prediabetes 11/21/2015  . Right groin pain 03/23/2018   Past Medical History:  Diagnosis Date  . Cancer ([place])   . History of cancer of right breast 11/04/2015  . HSV-2 (herpes simplex virus 2) infection   . Migraines   . Obesity 03/02/2016  . Osteoarthritis   . Rosacea   . Sleep apnea        Observations/Objective: No acute distress Normal breathing   Assessment and Plan: [name] KitchenMarland KitchenSua was seen today for follow-up.  Diagnoses and all orders for this visit:  Uncontrolled type 2 diabetes mellitus with hyperglycemia (HCC) -     insulin degludec (TRESIBA FLEXTOUCH) 100 UNIT/ML FlexTouch Pen; Inject 10 Units into the skin at bedtime. Increase by 2 units every 5 days until fasting glucose is below 120.   She is  going to start her husbands metformin.  She wants to hold off on SGLT-2 right now since she is already having yeast problems.  Added tresbia at night 10 units with titration instructions.  Continue ozempic.  Goal fasting glucose is under 120.  Follow up in 3 months.    Follow Up Instructions:    I discussed the assessment and treatment  plan with the patient. The patient was provided an opportunity to ask questions and all were answered. The patient agreed with the plan and demonstrated an understanding of the instructions.   The patient was advised to call back or seek an in-person evaluation if the symptoms worsen or if the condition fails to improve as anticipated.   Iran Planas, PA-C

## 2020-11-20 MED ORDER — NOVOFINE PLUS PEN NEEDLE 32G X 4 MM MISC: 1.0000 | Freq: Every day | 1 refills | Status: DC | PRN

## 2020-12-05 ENCOUNTER — Encounter: Payer: Self-pay | Admitting: Physician Assistant

## 2020-12-20 ENCOUNTER — Other Ambulatory Visit: Payer: Self-pay | Admitting: Physician Assistant

## 2020-12-20 DIAGNOSIS — K219 Gastro-esophageal reflux disease without esophagitis: Secondary | ICD-10-CM

## 2021-01-01 ENCOUNTER — Encounter: Payer: Self-pay | Admitting: Physician Assistant

## 2021-01-02 NOTE — Telephone Encounter (Signed)
praluent injection is working great to reduce LDL but pt is paying 100 dollars a month. Is there a way to help her with this?

## 2021-01-02 NOTE — Telephone Encounter (Signed)
Thank you so much

## 2021-01-16 ENCOUNTER — Encounter: Payer: Self-pay | Admitting: Physician Assistant

## 2021-01-16 MED ORDER — FREESTYLE LITE TEST VI STRP: ORAL_STRIP | 12 refills | Status: AC

## 2021-02-17 ENCOUNTER — Other Ambulatory Visit: Payer: Self-pay

## 2021-02-17 ENCOUNTER — Ambulatory Visit (INDEPENDENT_AMBULATORY_CARE_PROVIDER_SITE_OTHER): Payer: BC Managed Care – PPO | Admitting: Physician Assistant

## 2021-02-17 ENCOUNTER — Encounter: Payer: Self-pay | Admitting: Physician Assistant

## 2021-02-17 VITALS — BP 124/67 | HR 94 | Ht 62.5 in | Wt 176.0 lb

## 2021-02-17 DIAGNOSIS — E6609 Other obesity due to excess calories: Secondary | ICD-10-CM

## 2021-02-17 DIAGNOSIS — Z794 Long term (current) use of insulin: Secondary | ICD-10-CM | POA: Diagnosis not present

## 2021-02-17 DIAGNOSIS — E1169 Type 2 diabetes mellitus with other specified complication: Secondary | ICD-10-CM | POA: Diagnosis not present

## 2021-02-17 DIAGNOSIS — Z789 Other specified health status: Secondary | ICD-10-CM

## 2021-02-17 DIAGNOSIS — Z6831 Body mass index (BMI) 31.0-31.9, adult: Secondary | ICD-10-CM

## 2021-02-17 DIAGNOSIS — E785 Hyperlipidemia, unspecified: Secondary | ICD-10-CM

## 2021-02-17 LAB — POCT GLYCOSYLATED HEMOGLOBIN (HGB A1C): Hemoglobin A1C: 6.5 % — AB (ref 4.0–5.6)

## 2021-02-17 MED ORDER — METFORMIN HCL ER 750 MG PO TB24: 750.0000 mg | ORAL_TABLET | Freq: Every day | ORAL | 1 refills | Status: DC

## 2021-02-17 MED ORDER — OZEMPIC (1 MG/DOSE) 4 MG/3ML ~~LOC~~ SOPN: PEN_INJECTOR | SUBCUTANEOUS | 2 refills | Status: DC

## 2021-02-17 NOTE — Progress Notes (Signed)
Subjective:    Patient ID: Tina Mcgrath, female    DOB: 12-16-1955, 65 y.o.   MRN: [id number]  HPI Patient is a 65 year old obese female with type 2 diabetes, hyperlipidemia, GERD who presents to the clinic for 82-month follow-up.  At last visit her A1c was 12.8.  She has made drastic changes to get this down.  She initially started off with 12 units of insulin at bedtime.  For the past 3 weeks she has not had any bedtime insulin.  Her fasting sugars have been mostly under 130 and as low as 102 in the mornings.  She continues to take Ozempic 1 mg weekly and metformin. She has lost 15lbs. She has made many diet changes and is staying active in her garden.  Patient has had 1 episode where her vision was a little blurred and her sugar was down to 90.  She did eat something and felt better.  She denies any open sores or wounds.  She did lose her husband to COVID about 6 months ago.  She is handling it okay.  She denies any suicidal thoughts or homicidal idealizations.  She has good and bad moments.  .. Active Ambulatory Problems    Diagnosis Date Noted   Hyperlipidemia 08/19/2014   Elevated vitamin B12 level 08/19/2014   Hematuria 08/19/2014   Primary osteoarthritis of left knee 08/19/2014   Dry eyes 08/19/2014   Migraine without aura and with status migrainosus, not intractable 08/19/2014   Mild sleep apnea 08/19/2014   Fecal incontinence 08/19/2014   Elevated triglycerides with high cholesterol 09/03/2014   Diabetes mellitus ([place]) 09/03/2014   Proteinuria 09/10/2014   Chronic cystitis 01/06/2015   Vitamin D deficiency 08/27/2015   History of right breast cancer 11/04/2015   Class 1 obesity due to excess calories with serious comorbidity and body mass index (BMI) of 31.0 to 31.9 in adult 03/02/2016   Cough 03/02/2016   Carpal tunnel syndrome on both sides 08/23/2016   Ulnar neuropathy of both upper extremities 08/23/2016   Glaucoma suspect of both eyes 08/25/2016   Elevated liver enzymes  11/28/2016   Onychomycosis 11/28/2016   Fatty liver disease, nonalcoholic 32/95/1884   Hyperlipidemia associated with type 2 diabetes mellitus ([place]) 12/20/2017   Osteoarthritis of right hip 03/23/2018   Trouble in sleeping 06/28/2018   Grief 06/28/2018   Hip flexor tendinitis, right 06/28/2018   Statin intolerance 06/29/2018   Gastroesophageal reflux disease 11/14/2019   Family history of esophageal cancer 12/12/2019   Status post cataract extraction 02/12/2020   Status post right hip replacement 02/12/2020   Leg cramps 11/19/2020   Resolved Ambulatory Problems    Diagnosis Date Noted   Impaired fasting glucose 08/19/2014   Breast cancer ([place]) 11/13/2014   Elevated blood pressure 11/13/2014   Post-viral cough syndrome 11/04/2015   Prediabetes 11/21/2015   Right groin pain 03/23/2018   Past Medical History:  Diagnosis Date   Cancer [facility])    History of cancer of right breast 11/04/2015   HSV-2 (herpes simplex virus 2) infection    Migraines    Obesity 03/02/2016   Osteoarthritis    Rosacea    Sleep apnea       Review of Systems  All other systems reviewed and are negative.     Objective:   Physical Exam Vitals reviewed.  Constitutional:      Appearance: Normal appearance.  HENT:     Head: Normocephalic.  Cardiovascular:     Rate and Rhythm: Normal rate and  regular rhythm.     Pulses: Normal pulses.  Pulmonary:     Effort: Pulmonary effort is normal.     Breath sounds: Normal breath sounds.  Neurological:     General: No focal deficit present.     Mental Status: She is alert and oriented to person, place, and time.  Psychiatric:        Mood and Affect: Mood normal.         .. Results for orders placed or performed in visit on 02/17/21  POCT glycosylated hemoglobin (Hb A1C)  Result Value Ref Range   Hemoglobin A1C 6.5 (A) 4.0 - 5.6 %   HbA1c POC (<> result, manual entry)     HbA1c, POC (prediabetic range)     HbA1c, POC (controlled diabetic range)     .[name] Depression screen [facility] 2/9 02/17/2021 04/09/2019 09/27/2018 03/21/2018 09/23/2017  Decreased Interest 0 0 1 0 0  Down, Depressed, Hopeless 1 1 1  0 1  PHQ - 2 Score 1 1 2  0 1  Altered sleeping 0 2 2 2 1   Tired, decreased energy 1 0 0 0 0  Change in appetite 0 0 0 0 0  Feeling bad or failure about yourself  0 0 0 0 0  Trouble concentrating 0 0 0 0 0  Moving slowly or fidgety/restless 0 0 0 0 0  Suicidal thoughts 0 0 0 0 0  PHQ-9 Score 2 3 4 2 2   Difficult doing work/chores Not difficult at all Not difficult at all Not difficult at all Not difficult at all Not difficult at all     Assessment & Plan:  [name] KitchenMarland KitchenFelisa was seen today for diabetes.  Diagnoses and all orders for this visit:  Type 2 diabetes mellitus with other specified complication, with long-term current use of insulin (HCC) -     POCT glycosylated hemoglobin (Hb A1C) -     Semaglutide, 1 MG/DOSE, (OZEMPIC, 1 MG/DOSE,) 4 MG/3ML SOPN; INJECT 1MG  INTO THE SKIN ONCE WEEKLY -     metFORMIN (GLUCOPHAGE-XR) 750 MG 24 hr tablet; Take 1 tablet (750 mg total) by mouth daily with breakfast.  Statin intolerance  Class 1 obesity due to excess calories with serious comorbidity and body mass index (BMI) of 31.0 to 31.9 in adult  Hyperlipidemia associated with type 2 diabetes mellitus ([place])   A1C looks great compared to 3 months ago.  Off tresbia.  Continue ozempic and metformin.  BP to goal. Needs mircroalbumin. Pt cannot urinate today will do at next visit. Consider SGLT2 for kidney protection. GFR preserved.  On praulant. LDL 25. Stop zetia.  UTD eye and foot exam.  Declines covid/flu/pneumonia/shingles vaccination.

## 2021-03-07 IMAGING — DX DG LUMBAR SPINE COMPLETE 4+V
5 series · 5 of 5 positions shown · non-contrast
Comparison: None.

CLINICAL DATA: Low back pain radiating into the right leg for 2
days

EXAM:
LUMBAR SPINE - COMPLETE 4+ VIEW

[l-spine ap]
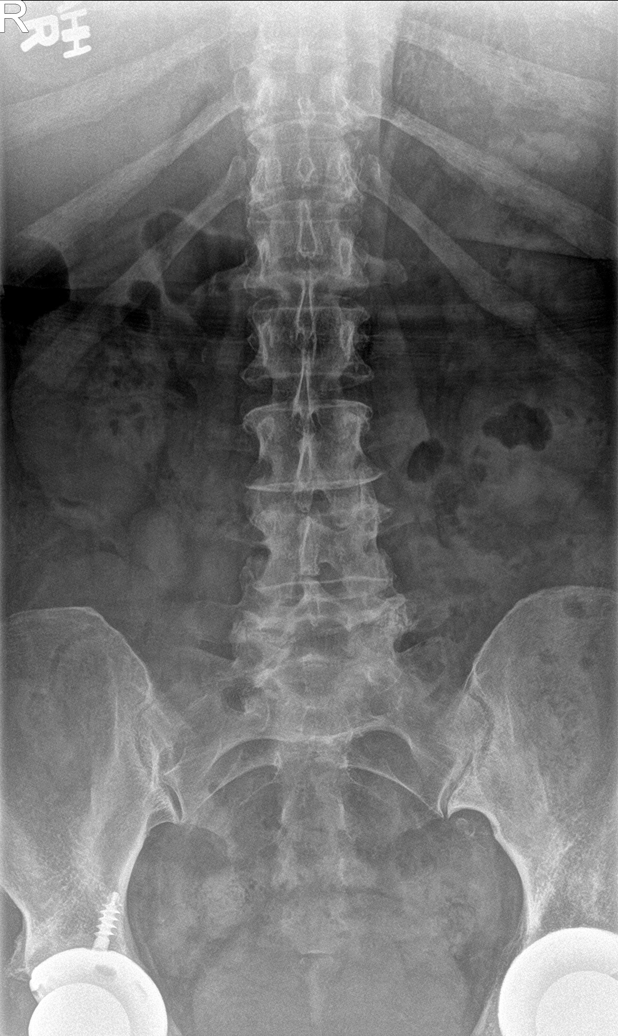

[l-spine obl (1 of 2)]
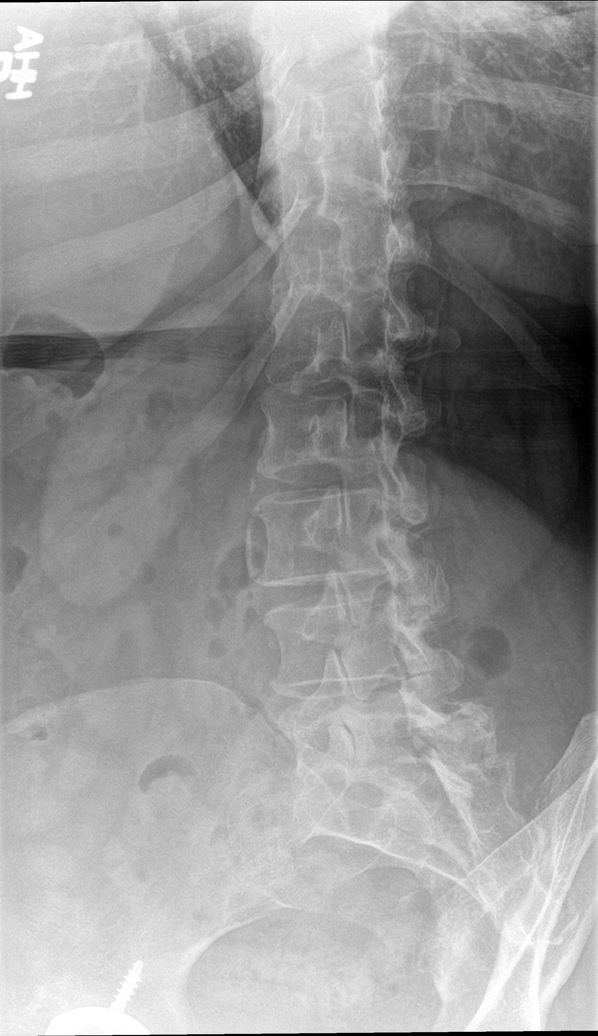

[l-spine obl (2 of 2)]
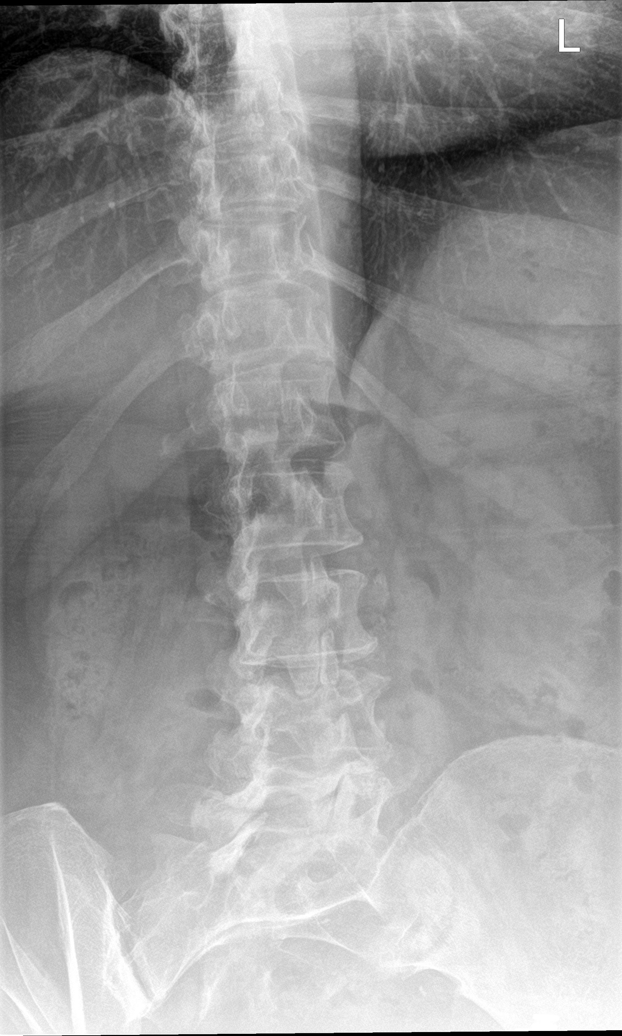

[l-spine lat]
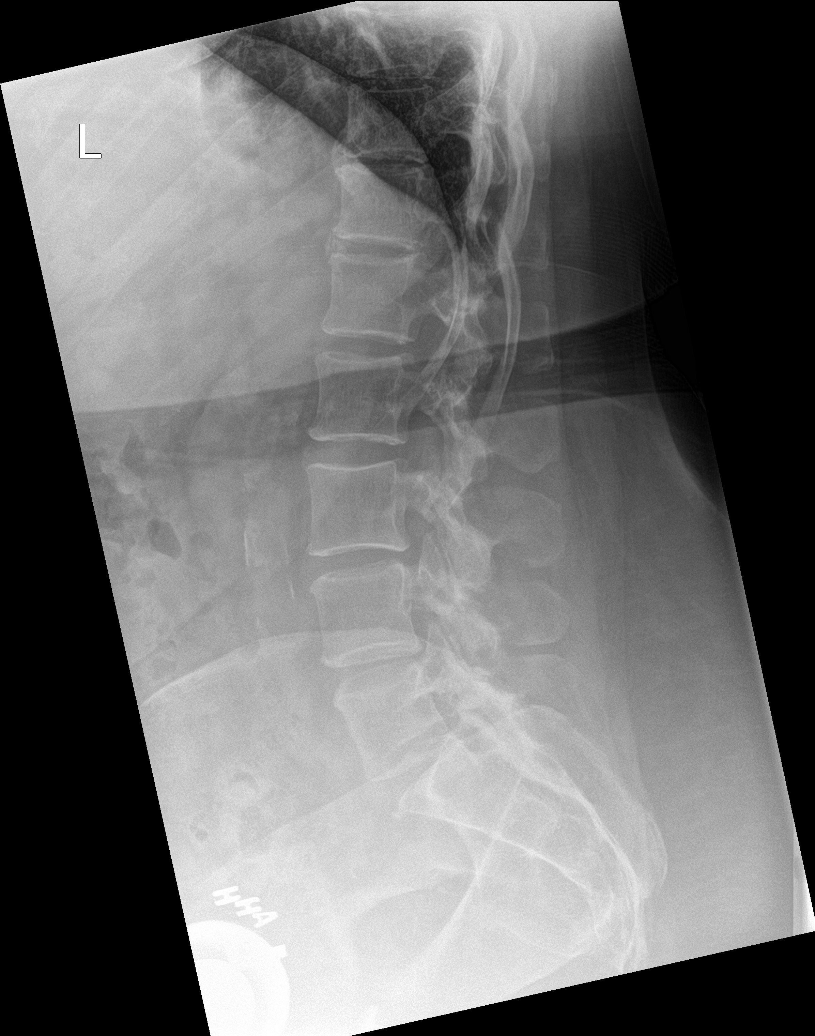

[l-spine spot]
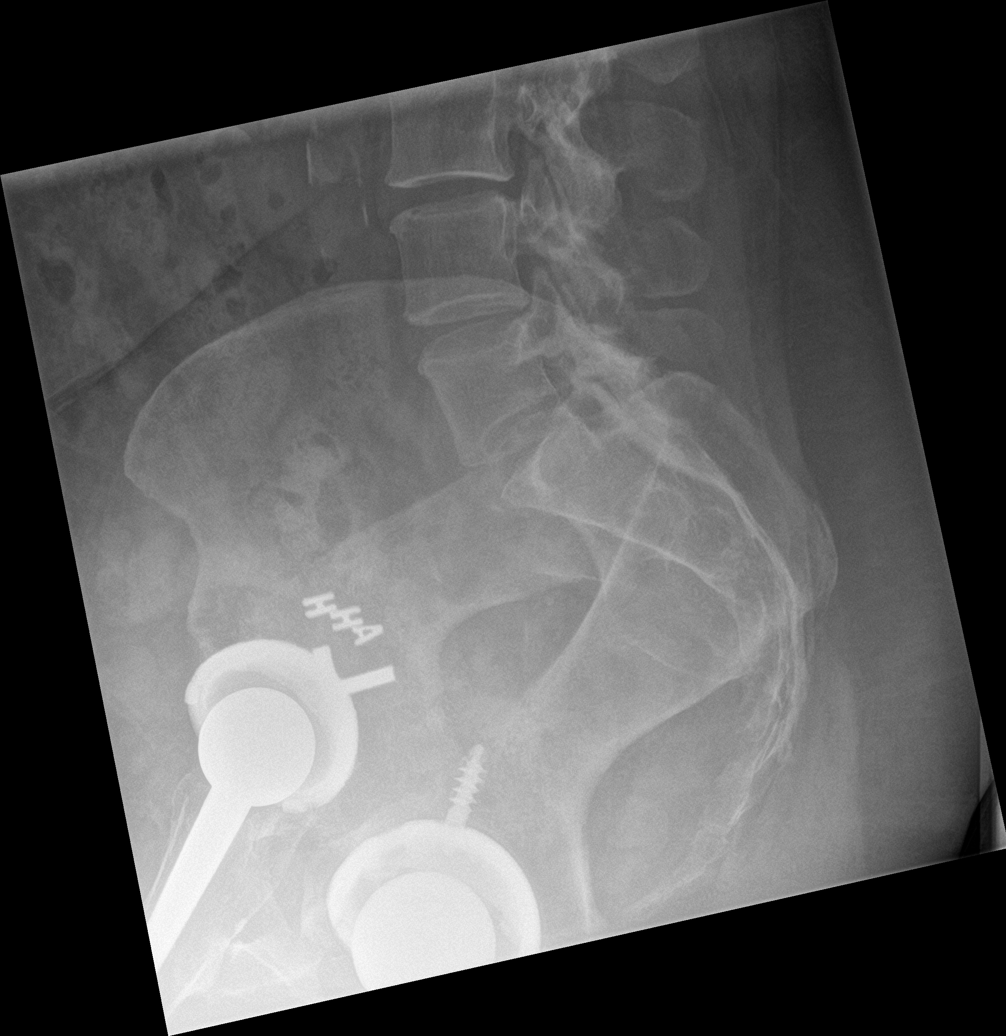

[5 of 5 positions shown; findings below may reference images not displayed]

FINDINGS: Five lumbar type vertebral bodies are well visualized. Vertebral
body height is well maintained. No pars defects are noted. Mild
facet hypertrophic changes are seen. No anterolisthesis is noted.
Mild aortic calcifications are noted. Bilateral hip replacements are
seen.
IMPRESSION: Mild degenerative change without acute abnormality.

## 2021-05-04 ENCOUNTER — Other Ambulatory Visit: Payer: Self-pay | Admitting: Neurology

## 2021-05-04 MED ORDER — GABAPENTIN 100 MG PO CAPS: 100.0000 mg | ORAL_CAPSULE | Freq: Three times a day (TID) | ORAL | 1 refills | Status: DC

## 2021-05-20 ENCOUNTER — Encounter: Payer: Self-pay | Admitting: Physician Assistant

## 2021-05-20 ENCOUNTER — Ambulatory Visit (INDEPENDENT_AMBULATORY_CARE_PROVIDER_SITE_OTHER): Payer: BC Managed Care – PPO | Admitting: Physician Assistant

## 2021-05-20 VITALS — BP 120/89 | HR 100 | Temp 98.7°F | Ht 62.5 in | Wt 162.0 lb

## 2021-05-20 DIAGNOSIS — E663 Overweight: Secondary | ICD-10-CM

## 2021-05-20 DIAGNOSIS — E1169 Type 2 diabetes mellitus with other specified complication: Secondary | ICD-10-CM | POA: Diagnosis not present

## 2021-05-20 DIAGNOSIS — F4321 Adjustment disorder with depressed mood: Secondary | ICD-10-CM | POA: Diagnosis not present

## 2021-05-20 DIAGNOSIS — R229 Localized swelling, mass and lump, unspecified: Secondary | ICD-10-CM | POA: Insufficient documentation

## 2021-05-20 DIAGNOSIS — Z794 Long term (current) use of insulin: Secondary | ICD-10-CM

## 2021-05-20 DIAGNOSIS — Z1231 Encounter for screening mammogram for malignant neoplasm of breast: Secondary | ICD-10-CM | POA: Diagnosis not present

## 2021-05-20 DIAGNOSIS — E785 Hyperlipidemia, unspecified: Secondary | ICD-10-CM

## 2021-05-20 LAB — POCT GLYCOSYLATED HEMOGLOBIN (HGB A1C): Hemoglobin A1C: 5.5 % (ref 4.0–5.6)

## 2021-05-20 LAB — POCT UA - MICROALBUMIN
Albumin/Creatinine Ratio, Urine, POC: <30
Creatinine, POC: 200 mg/dL
Microalbumin Ur, POC: 30 mg/L

## 2021-05-20 MED ORDER — OZEMPIC (1 MG/DOSE) 4 MG/3ML ~~LOC~~ SOPN: PEN_INJECTOR | SUBCUTANEOUS | 2 refills | Status: DC

## 2021-05-20 NOTE — Progress Notes (Signed)
Subjective:    Patient ID: Tina Mcgrath, female    DOB: [birth year], 65 y.o.   MRN: [id number]  HPI Patient is a 65 year old female with migraines, sleep apnea, GERD, T2DM who presents to the clinic for 3 month follow up.   Pt is doing great.  She continues to lose weight steadily.  She is on metformin and Ozempic and compliant.  She denies any side effects.  She denies any open sores or wounds.  She denies any hypoglycemic events.  She is checking her sugars fasting in the morning and they are around 95-1 10.  In the evenings before bed they around 150.  She denies any chest pain, palpitations, headaches or vision changes.  GERD is controlled.  Her brother was recently found dead in [place].  It is suspected that he was intentionally overdosed by his drug dealer.  This is been very stressful for her.  She is having to travel back and forth to [place].  She denies any suicidal thoughts or homicidal idealizations.  She does feel more tearful and emotional than normal.  Patient did palpate a mass of her right flank mid back area.  It is a little tender.  She is concerned because she has a history of breast cancer and was told that some of her cancer cells were lining her rib.  She denies any shortness of breath, cough, trouble breathing, fever or chills. She is trying to lose weight and she is.   .. Active Ambulatory Problems    Diagnosis Date Noted   Hyperlipidemia 08/19/2014   Elevated vitamin B12 level 08/19/2014   Hematuria 08/19/2014   Primary osteoarthritis of left knee 08/19/2014   Dry eyes 08/19/2014   Migraine without aura and with status migrainosus, not intractable 08/19/2014   Mild sleep apnea 08/19/2014   Fecal incontinence 08/19/2014   Elevated triglycerides with high cholesterol 09/03/2014   Diabetes mellitus ([place]) 09/03/2014   Proteinuria 09/10/2014   Chronic cystitis 01/06/2015   Vitamin D deficiency 08/27/2015   History of right breast cancer 11/04/2015   Class 1  obesity due to excess calories with serious comorbidity and body mass index (BMI) of 31.0 to 31.9 in adult 03/02/2016   Cough 03/02/2016   Carpal tunnel syndrome on both sides 08/23/2016   Ulnar neuropathy of both upper extremities 08/23/2016   Glaucoma suspect of both eyes 08/25/2016   Elevated liver enzymes 11/28/2016   Onychomycosis 11/28/2016   Fatty liver disease, nonalcoholic [id number]   Hyperlipidemia associated with type 2 diabetes mellitus ([place]) 12/20/2017   Osteoarthritis of right hip 03/23/2018   Trouble in sleeping 06/28/2018   Grief reaction 06/28/2018   Hip flexor tendinitis, right 06/28/2018   Statin intolerance 06/29/2018   Gastroesophageal reflux disease 11/14/2019   Family history of esophageal cancer 12/12/2019   Status post cataract extraction 02/12/2020   Status post right hip replacement 02/12/2020   Leg cramps 11/19/2020   Skin mass 05/20/2021   Resolved Ambulatory Problems    Diagnosis Date Noted   Impaired fasting glucose 08/19/2014   Breast cancer ([place]) 11/13/2014   Elevated blood pressure 11/13/2014   Post-viral cough syndrome 11/04/2015   Prediabetes 11/21/2015   Right groin pain 03/23/2018   Past Medical History:  Diagnosis Date   Cancer [facility])    History of cancer of right breast 11/04/2015   HSV-2 (herpes simplex virus 2) infection    Migraines    Obesity 03/02/2016   Osteoarthritis    Rosacea    Sleep apnea  Review of Systems See HPI.     Objective:   Physical Exam Vitals reviewed.  Constitutional:      Appearance: Normal appearance.  HENT:     Head: Normocephalic.  Cardiovascular:     Rate and Rhythm: Normal rate and regular rhythm.     Pulses: Normal pulses.     Heart sounds: Normal heart sounds.  Pulmonary:     Effort: Pulmonary effort is normal.     Breath sounds: Normal breath sounds.  Musculoskeletal:     Right lower leg: No edema.     Left lower leg: No edema.  Neurological:     General: No focal deficit  present.     Mental Status: She is alert and oriented to person, place, and time.      .. Depression screen [facility] 2/9 05/20/2021 02/17/2021 04/09/2019 09/27/2018 03/21/2018  Decreased Interest 1 0 0 1 0  Down, Depressed, Hopeless '1 1 1 1 '$ 0  PHQ - 2 Score '2 1 1 2 '$ 0  Altered sleeping 1 0 '2 2 2  '$ Tired, decreased energy 0 1 0 0 0  Change in appetite 0 0 0 0 0  Feeling bad or failure about yourself  0 0 0 0 0  Trouble concentrating 0 0 0 0 0  Moving slowly or fidgety/restless 0 0 0 0 0  Suicidal thoughts 0 0 0 0 0  PHQ-9 Score '3 2 3 4 2  '$ Difficult doing work/chores Not difficult at all Not difficult at all Not difficult at all Not difficult at all Not difficult at all   .[name] GAD 7 : Generalized Anxiety Score 05/20/2021 04/09/2019 09/27/2018 03/21/2018  Nervous, Anxious, on Edge 0 0 0 0  Control/stop worrying 1 0 0 2  Worry too much - different things 1 0 0 0  Trouble relaxing 0 0 0 0  Restless 0 0 0 0  Easily annoyed or irritable 0 0 0 0  Afraid - awful might happen 0 0 0 0  Total GAD 7 Score 2 0 0 2  Anxiety Difficulty Not difficult at all - Not difficult at all Not difficult at all    .[name] Results for orders placed or performed in visit on 05/20/21  POCT glycosylated hemoglobin (Hb A1C)  Result Value Ref Range   Hemoglobin A1C 5.5 4.0 - 5.6 %   HbA1c POC (<> result, manual entry)     HbA1c, POC (prediabetic range)     HbA1c, POC (controlled diabetic range)    POCT UA - Microalbumin  Result Value Ref Range   Microalbumin Ur, POC 30 mg/L   Creatinine, POC 200 mg/dL   Albumin/Creatinine Ratio, Urine, POC <30        Assessment & Plan:  [name] KitchenMarland KitchenNaveena was seen today for diabetes and hyperlipidemia.  Diagnoses and all orders for this visit:  Type 2 diabetes mellitus with other specified complication, with long-term current use of insulin (HCC) -     POCT glycosylated hemoglobin (Hb A1C) -     POCT UA - Microalbumin -     Semaglutide, 1 MG/DOSE, (OZEMPIC, 1 MG/DOSE,) 4 MG/3ML SOPN; INJECT '1MG'$   INTO THE SKIN ONCE WEEKLY -     COMPLETE METABOLIC PANEL WITH GFR -     CBC with Differential/Platelet  Hyperlipidemia associated with type 2 diabetes mellitus (HCC)  Grief reaction  Skin mass  Visit for screening mammogram -     MM 3D SCREEN BREAST BILATERAL   A1c is 5.5 and looks great. Continue  on Ozempic and metformin. Blood pressure to goal. Not on ACE but no protein in microalbumin. On Praluent because cannot tolerate statin. Declined COVID, pneumonia, flu vaccines. Patient has up-to-date eye exam and foot exam. Follow-up in 3 months.  Patient is due for mammogram.  She does have a history of breast cancer.  Screening mammogram was ordered.  She does have a mobile skin mass in her right mid back.  She is concerned about this.  We will get an ultrasound to fully evaluate.  Discussed death of her brother. Pt declines any counseling or medication. Follow up as needed.

## 2021-05-21 LAB — COMPLETE METABOLIC PANEL WITH GFR
AG Ratio: 1.9 (calc) (ref 1.0–2.5)
ALT: 12 U/L (ref 6–29)
AST: 16 U/L (ref 10–35)
Albumin: 4.8 g/dL (ref 3.6–5.1)
Alkaline phosphatase (APISO): 59 U/L (ref 37–153)
BUN: 11 mg/dL (ref 7–25)
CO2: 28 mmol/L (ref 20–32)
Calcium: 10.6 mg/dL — ABNORMAL HIGH (ref 8.6–10.4)
Chloride: 102 mmol/L (ref 98–110)
Creat: 0.83 mg/dL (ref 0.50–1.05)
Globulin: 2.5 g/dL (calc) (ref 1.9–3.7)
Glucose, Bld: 100 mg/dL — ABNORMAL HIGH (ref 65–99)
Potassium: 5.2 mmol/L (ref 3.5–5.3)
Sodium: 140 mmol/L (ref 135–146)
Total Bilirubin: 0.7 mg/dL (ref 0.2–1.2)
Total Protein: 7.3 g/dL (ref 6.1–8.1)
eGFR: 80 mL/min/{1.73_m2} (ref >=60)

## 2021-05-21 LAB — CBC WITH DIFFERENTIAL/PLATELET
Absolute Monocytes: 384 cells/uL (ref 200–950)
Basophils Absolute: 39 cells/uL (ref 0–200)
Basophils Relative: 0.6 %
Eosinophils Absolute: 59 cells/uL (ref 15–500)
Eosinophils Relative: 0.9 %
HCT: 48.3 % — ABNORMAL HIGH (ref 35.0–45.0)
Hemoglobin: 16.1 g/dL — ABNORMAL HIGH (ref 11.7–15.5)
Lymphs Abs: 1859 cells/uL (ref 850–3900)
MCH: 29.2 pg (ref 27.0–33.0)
MCHC: 33.3 g/dL (ref 32.0–36.0)
MCV: 87.5 fL (ref 80.0–100.0)
MPV: 9.4 fL (ref 7.5–12.5)
Monocytes Relative: 5.9 %
Neutro Abs: 4160 cells/uL (ref 1500–7800)
Neutrophils Relative %: 64 %
Platelets: 316 10*3/uL (ref 140–400)
RBC: 5.52 10*6/uL — ABNORMAL HIGH (ref 3.80–5.10)
RDW: 13.4 % (ref 11.0–15.0)
Total Lymphocyte: 28.6 %
WBC: 6.5 10*3/uL (ref 3.8–10.8)

## 2021-05-24 NOTE — Progress Notes (Signed)
[place],   Sugars are much better.  Kidney and liver look good.  Calcium was up some on todays lab and hemoglobin. Could you have been a little dehydrated? We will keep an eye on them.

## 2021-05-26 ENCOUNTER — Other Ambulatory Visit: Payer: Self-pay

## 2021-05-26 ENCOUNTER — Ambulatory Visit (INDEPENDENT_AMBULATORY_CARE_PROVIDER_SITE_OTHER): Payer: BC Managed Care – PPO

## 2021-05-26 DIAGNOSIS — R222 Localized swelling, mass and lump, trunk: Secondary | ICD-10-CM

## 2021-05-26 DIAGNOSIS — R229 Localized swelling, mass and lump, unspecified: Secondary | ICD-10-CM

## 2021-05-27 NOTE — Progress Notes (Signed)
Lipoma as suspected. If starts causing any pain or issues can consider removal but removal is not needed.

## 2021-06-17 ENCOUNTER — Encounter: Payer: Self-pay | Admitting: Physician Assistant

## 2021-06-19 NOTE — Telephone Encounter (Signed)
To me this is not clear it just says "exceeds plan limit" but then it shows a price that she should pay. has she tried to process these at the pharmacy for refills?  She may need to call her insurance company directly to find out what they need.  The pantoprazole is generic so I am not sure what the issue could possibly be without 1.  The Ozempic is branded.  Do they have a max on how many she is allowed per year and she is hit that?  This is very nonspecific and I do not really have any idea..     In regards to the Praluent we can definitely request prior authorization her previous one may have expired we can certainly start working on that.   I am going to route this to the pool to contact the patient but also route to [place] to start prior Auth.

## 2021-06-22 ENCOUNTER — Telehealth: Payer: Self-pay

## 2021-06-22 NOTE — Telephone Encounter (Signed)
Medication: Alirocumab (PRALUENT) 75 MG/ML SOAJ Prior authorization submitted via CoverMyMeds on 06/19/2021 [place] submission pending

## 2021-06-22 NOTE — Telephone Encounter (Signed)
Medication: Alirocumab (PRALUENT) 69 MG/ML SOAJ Prior authorization determination received Medication has been denied Reason for denial:  "Current plan approved criteria does not allow coverage of Praluent unless the patient has one of the following diagnoses: clinical atherosclerotic cardiovascular disease, primary hyperlipidemia (including heterozygous familial hypercholesterolemia), or homozygous familial ypercholesterolemia. Additional coverage criteria may apply, please review policy or plan documents for full requirements."

## 2021-07-02 ENCOUNTER — Telehealth: Payer: Self-pay

## 2021-07-02 NOTE — Telephone Encounter (Signed)
Medication: pantoprazole (PROTONIX) 40 MG tablet Prior authorization submitted via CoverMyMeds on 07/02/2021 [place] submission pending

## 2021-07-02 NOTE — Telephone Encounter (Signed)
Medication: Semaglutide, 1 MG/DOSE, (OZEMPIC, 1 MG/DOSE,) 4 MG/3ML SOPN Prior authorization determination received Medication has been approved Approval dates: 05/27/2020-05/27/2023  Patient aware via: [place] aware: Yes Provider aware via this encounter

## 2021-07-02 NOTE — Telephone Encounter (Signed)
Medication: Alirocumab (PRALUENT) 75 MG/ML SOAJ Prior authorization submitted via CoverMyMeds on 07/02/2021 [place] submission pending

## 2021-07-02 NOTE — Telephone Encounter (Signed)
Medication: pantoprazole (PROTONIX) 40 MG tablet Prior authorization determination received Medication has been approved Approval dates: 07/02/2021-07/02/2022  Patient aware via: [place] aware: Yes Provider aware via this encounter

## 2021-07-09 NOTE — Telephone Encounter (Signed)
Medication: Alirocumab (PRALUENT) 77 MG/ML SOAJ Prior authorization determination received Medication has been denied Reason for denial:  "Current plan approved criteria does not allow coverage of Praluent unless the patient has one of the following diagnoses:  clinical atherosclerotic cardiovascular disease, primary hyperlipidemia (including heterozygous familial hypercholesteremia), or homozygous familial hypercholesterolemia. Additional coverage criteria may apply"

## 2021-07-10 ENCOUNTER — Other Ambulatory Visit: Payer: Self-pay | Admitting: Physician Assistant

## 2021-07-10 DIAGNOSIS — E1169 Type 2 diabetes mellitus with other specified complication: Secondary | ICD-10-CM

## 2021-07-20 LAB — HM MAMMOGRAPHY

## 2021-07-21 ENCOUNTER — Encounter: Payer: Self-pay | Admitting: Physician Assistant

## 2021-08-12 ENCOUNTER — Other Ambulatory Visit: Payer: Self-pay

## 2021-08-12 ENCOUNTER — Ambulatory Visit: Payer: BC Managed Care – PPO | Admitting: Physician Assistant

## 2021-08-12 ENCOUNTER — Encounter: Payer: Self-pay | Admitting: Physician Assistant

## 2021-08-12 VITALS — BP 134/75 | HR 102 | Ht 62.5 in | Wt 158.0 lb

## 2021-08-12 DIAGNOSIS — E1169 Type 2 diabetes mellitus with other specified complication: Secondary | ICD-10-CM

## 2021-08-12 DIAGNOSIS — E782 Mixed hyperlipidemia: Secondary | ICD-10-CM

## 2021-08-12 DIAGNOSIS — Z794 Long term (current) use of insulin: Secondary | ICD-10-CM

## 2021-08-12 DIAGNOSIS — Z789 Other specified health status: Secondary | ICD-10-CM

## 2021-08-12 MED ORDER — OZEMPIC (1 MG/DOSE) 4 MG/3ML ~~LOC~~ SOPN: PEN_INJECTOR | SUBCUTANEOUS | 2 refills | Status: DC

## 2021-08-12 MED ORDER — METFORMIN HCL ER 750 MG PO TB24
750.0000 mg | ORAL_TABLET | Freq: Every day | ORAL | 1 refills | Status: DC
Start: 2021-08-12 — End: 2022-02-09

## 2021-08-12 NOTE — Progress Notes (Signed)
Subjective:    Patient ID: Tina Mcgrath, female    DOB: 1956-07-10, 65 y.o.   MRN: [id number]  HPI Pt is a 65 yo female with T2DM, HLD, HTN who presents to the clinic for 3 month follow up.   Pt is doing well. No concerns or complaints. No hypoglycemic events. No open sores or wounds. Pt checks sugar every morning and running 90-120. Taking medication regularly with no problems. She tries to stay active. No CP, palpitations, headaches or vision changes.     Review of Systems  All other systems reviewed and are negative.     Objective:   Physical Exam Vitals reviewed.  Constitutional:      Appearance: Normal appearance.  HENT:     Head: Normocephalic.  Neck:     Vascular: No carotid bruit.  Cardiovascular:     Rate and Rhythm: Normal rate and regular rhythm.     Pulses: Normal pulses.     Heart sounds: Normal heart sounds.  Pulmonary:     Effort: Pulmonary effort is normal.     Breath sounds: Normal breath sounds.  Neurological:     General: No focal deficit present.     Mental Status: She is alert and oriented to person, place, and time.  Psychiatric:        Mood and Affect: Mood normal.    .. Depression screen [facility] 2/9 05/20/2021 02/17/2021 04/09/2019 09/27/2018 03/21/2018  Decreased Interest 1 0 0 1 0  Down, Depressed, Hopeless 1 1 1 1  0  PHQ - 2 Score 2 1 1 2  0  Altered sleeping 1 0 2 2 2   Tired, decreased energy 0 1 0 0 0  Change in appetite 0 0 0 0 0  Feeling bad or failure about yourself  0 0 0 0 0  Trouble concentrating 0 0 0 0 0  Moving slowly or fidgety/restless 0 0 0 0 0  Suicidal thoughts 0 0 0 0 0  PHQ-9 Score 3 2 3 4 2   Difficult doing work/chores Not difficult at all Not difficult at all Not difficult at all Not difficult at all Not difficult at all   .[name] GAD 7 : Generalized Anxiety Score 05/20/2021 04/09/2019 09/27/2018 03/21/2018  Nervous, Anxious, on Edge 0 0 0 0  Control/stop worrying 1 0 0 2  Worry too much - different things 1 0 0 0  Trouble relaxing 0  0 0 0  Restless 0 0 0 0  Easily annoyed or irritable 0 0 0 0  Afraid - awful might happen 0 0 0 0  Total GAD 7 Score 2 0 0 2  Anxiety Difficulty Not difficult at all - Not difficult at all Not difficult at all          Assessment & Plan:  [name] KitchenMarland KitchenZahrah was seen today for follow-up.  Diagnoses and all orders for this visit:  Type 2 diabetes mellitus with other specified complication, with long-term current use of insulin (HCC) -     metFORMIN (GLUCOPHAGE-XR) 750 MG 24 hr tablet; Take 1 tablet (750 mg total) by mouth daily with breakfast. -     Semaglutide, 1 MG/DOSE, (OZEMPIC, 1 MG/DOSE,) 4 MG/3ML SOPN; INJECT 1 MG INTO THE SKIN ONCE WEEKLY  Statin intolerance  Elevated triglycerides with high cholesterol  Too soon for A1C.  Pt brings in log for fastings and most 90-120.  Continue ozempic and metformin.  BP to goal.  Not on statin. Insurance declined praluent.  Declined covid/flu/shingles/pneumonia vaccine.  Follow up in 3 months.

## 2021-08-13 LAB — HM DEXA SCAN: HM Dexa Scan: NORMAL

## 2021-10-28 ENCOUNTER — Other Ambulatory Visit: Payer: Self-pay | Admitting: Physician Assistant

## 2021-10-31 ENCOUNTER — Other Ambulatory Visit: Payer: Self-pay | Admitting: Physician Assistant

## 2021-10-31 DIAGNOSIS — Z794 Long term (current) use of insulin: Secondary | ICD-10-CM

## 2021-10-31 DIAGNOSIS — E1169 Type 2 diabetes mellitus with other specified complication: Secondary | ICD-10-CM

## 2021-11-10 ENCOUNTER — Other Ambulatory Visit: Payer: Self-pay

## 2021-11-10 ENCOUNTER — Encounter: Payer: Self-pay | Admitting: Physician Assistant

## 2021-11-10 ENCOUNTER — Ambulatory Visit: Payer: BC Managed Care – PPO | Admitting: Physician Assistant

## 2021-11-10 VITALS — BP 137/76 | HR 110 | Ht 62.5 in | Wt 160.0 lb

## 2021-11-10 DIAGNOSIS — R748 Abnormal levels of other serum enzymes: Secondary | ICD-10-CM

## 2021-11-10 DIAGNOSIS — E782 Mixed hyperlipidemia: Secondary | ICD-10-CM | POA: Diagnosis not present

## 2021-11-10 DIAGNOSIS — E1169 Type 2 diabetes mellitus with other specified complication: Secondary | ICD-10-CM | POA: Diagnosis not present

## 2021-11-10 DIAGNOSIS — R7989 Other specified abnormal findings of blood chemistry: Secondary | ICD-10-CM

## 2021-11-10 DIAGNOSIS — Z79899 Other long term (current) drug therapy: Secondary | ICD-10-CM

## 2021-11-10 DIAGNOSIS — E785 Hyperlipidemia, unspecified: Secondary | ICD-10-CM

## 2021-11-10 DIAGNOSIS — E559 Vitamin D deficiency, unspecified: Secondary | ICD-10-CM

## 2021-11-10 DIAGNOSIS — Z794 Long term (current) use of insulin: Secondary | ICD-10-CM

## 2021-11-10 DIAGNOSIS — Z1329 Encounter for screening for other suspected endocrine disorder: Secondary | ICD-10-CM

## 2021-11-10 NOTE — Progress Notes (Signed)
? ?Subjective:  ? ? Patient ID: Tina Mcgrath, female    DOB: 1956/08/23, 66 y.o.   MRN: [id number] ? ?HPI ?Pt is a 66 yo female with T2DM, Migraines, HLD, GERD who presents to the clinic for 3 month follow up.  ? ?Pt is very compliant with medications and checking her morning sugars. She takes ozempic and metformin She brings in log and most are right around 100 in the morning. No open sores or wounds. No CP, palpitations, headaches, vision changes. No hypoglycemic events.  ? ?.. ?Active Ambulatory Problems  ?  Diagnosis Date Noted  ? Hyperlipidemia 08/19/2014  ? Elevated vitamin B12 level 08/19/2014  ? Hematuria 08/19/2014  ? Primary osteoarthritis of left knee 08/19/2014  ? Dry eyes 08/19/2014  ? Migraine without aura and with status migrainosus, not intractable 08/19/2014  ? Mild sleep apnea 08/19/2014  ? Fecal incontinence 08/19/2014  ? Elevated triglycerides with high cholesterol 09/03/2014  ? Diabetes mellitus ([place]) 09/03/2014  ? Proteinuria 09/10/2014  ? Chronic cystitis 01/06/2015  ? Vitamin D deficiency 08/27/2015  ? History of right breast cancer 11/04/2015  ? Cough 03/02/2016  ? Carpal tunnel syndrome on both sides 08/23/2016  ? Ulnar neuropathy of both upper extremities 08/23/2016  ? Glaucoma suspect of both eyes 08/25/2016  ? Elevated liver enzymes 11/28/2016  ? Onychomycosis 11/28/2016  ? Fatty liver disease, nonalcoholic 56/25/6389  ? Hyperlipidemia associated with type 2 diabetes mellitus ([place]) 12/20/2017  ? Osteoarthritis of right hip 03/23/2018  ? Trouble in sleeping 06/28/2018  ? Grief reaction 06/28/2018  ? Hip flexor tendinitis, right 06/28/2018  ? Statin intolerance 06/29/2018  ? Gastroesophageal reflux disease 11/14/2019  ? Family history of esophageal cancer 12/12/2019  ? Status post cataract extraction 02/12/2020  ? Status post right hip replacement 02/12/2020  ? Leg cramps 11/19/2020  ? Skin mass 05/20/2021  ? Overweight (BMI 25.0-29.9) 05/20/2021  ? ?Resolved Ambulatory Problems  ?  Diagnosis  Date Noted  ? Impaired fasting glucose 08/19/2014  ? Breast cancer ([place]) 11/13/2014  ? Elevated blood pressure 11/13/2014  ? Post-viral cough syndrome 11/04/2015  ? Prediabetes 11/21/2015  ? Class 1 obesity due to excess calories with serious comorbidity and body mass index (BMI) of 31.0 to 31.9 in adult 03/02/2016  ? Right groin pain 03/23/2018  ? ?Past Medical History:  ?Diagnosis Date  ? Cancer [facility])   ? History of cancer of right breast 11/04/2015  ? HSV-2 (herpes simplex virus 2) infection   ? Migraines   ? Obesity 03/02/2016  ? Osteoarthritis   ? Rosacea   ? Sleep apnea   ? ? ? ? ? ?Review of Systems  ?All other systems reviewed and are negative. ? ?   ?Objective:  ? Physical Exam ?Vitals reviewed.  ?Constitutional:   ?   Appearance: Normal appearance.  ?HENT:  ?   Head: Normocephalic.  ?Cardiovascular:  ?   Rate and Rhythm: Normal rate and regular rhythm.  ?   Pulses: Normal pulses.  ?   Heart sounds: Murmur heard.  ?Pulmonary:  ?   Effort: Pulmonary effort is normal.  ?   Breath sounds: Normal breath sounds.  ?Musculoskeletal:  ?   Right lower leg: No edema.  ?   Left lower leg: No edema.  ?Neurological:  ?   General: No focal deficit present.  ?   Mental Status: She is alert and oriented to person, place, and time.  ?Psychiatric:     ?   Mood and Affect: Mood normal.  ? ?.[name] ?  Depression screen [facility] 2/9 11/10/2021 08/12/2021 05/20/2021 02/17/2021 04/09/2019  ?Decreased Interest 0 0 1 0 0  ?Down, Depressed, Hopeless 0 '1 1 1 1  '$ ?PHQ - 2 Score 0 '1 2 1 1  '$ ?Altered sleeping - 3 1 0 2  ?Tired, decreased energy - 0 0 1 0  ?Change in appetite - 0 0 0 0  ?Feeling bad or failure about yourself  - 0 0 0 0  ?Trouble concentrating - 0 0 0 0  ?Moving slowly or fidgety/restless - 0 0 0 0  ?Suicidal thoughts - 0 0 0 0  ?PHQ-9 Score - '4 3 2 3  '$ ?Difficult doing work/chores - Not difficult at all Not difficult at all Not difficult at all Not difficult at all  ? ?.. ?GAD 7 : Generalized Anxiety Score 08/12/2021 05/20/2021 04/09/2019 09/27/2018   ?Nervous, Anxious, on Edge 0 0 0 0  ?Control/stop worrying 0 1 0 0  ?Worry too much - different things 0 1 0 0  ?Trouble relaxing 1 0 0 0  ?Restless 0 0 0 0  ?Easily annoyed or irritable 0 0 0 0  ?Afraid - awful might happen 0 0 0 0  ?Total GAD 7 Score 1 2 0 0  ?Anxiety Difficulty Not difficult at all Not difficult at all - Not difficult at all  ? ? ? ? ? ? ? ?   ?Assessment & Plan:  ?..Arizbeth was seen today for follow-up. ? ?Diagnoses and all orders for this visit: ? ?Type 2 diabetes mellitus with other specified complication, with long-term current use of insulin (HCC) ?-     COMPLETE METABOLIC PANEL WITH GFR ?-     Hemoglobin A1c ? ?Elevated triglycerides with high cholesterol ?-     Lipid Panel w/reflex Direct LDL ? ?Hyperlipidemia associated with type 2 diabetes mellitus ([place]) ?-     Lipid Panel w/reflex Direct LDL ? ?Elevated vitamin B12 level ?-     Vitamin B12 ? ?Vitamin D deficiency ?-     Vitamin D (25 hydroxy) ? ?Thyroid disorder screen ?-     TSH ? ?Medication management ?-     TSH ?-     Lipid Panel w/reflex Direct LDL ?-     COMPLETE METABOLIC PANEL WITH GFR ?-     CBC with Differential/Platelet ?-     Vitamin D (25 hydroxy) ?-     Vitamin B12 ? ? ?Will get A1C in labs ?Fasting glucose readings look great ?Continue on same medication ?BP not quite to goal but per patient at home under 130/80.  ?Cannot tolerate statin and insurance will not approve PsK. ?.. ?Diabetic Foot Exam - Simple   ?Simple Foot Form ?Diabetic Foot exam was performed with the following findings: Yes 11/10/2021  9:21 AM  ?Visual Inspection ?No deformities, no ulcerations, no other skin breakdown bilaterally: Yes ?Sensation Testing ?Intact to touch and monofilament testing bilaterally: Yes ?Pulse Check ?Posterior Tibialis and Dorsalis pulse intact bilaterally: Yes ?Comments ?  ? ?Needs eye exam. ?Declines covid vaccine/flu shot/pneumonia vaccine/shingles vaccine.  ?Follow up in 3 months.  ? ?

## 2021-11-11 ENCOUNTER — Encounter: Payer: Self-pay | Admitting: Physician Assistant

## 2021-11-11 DIAGNOSIS — E785 Hyperlipidemia, unspecified: Secondary | ICD-10-CM

## 2021-11-11 DIAGNOSIS — E1169 Type 2 diabetes mellitus with other specified complication: Secondary | ICD-10-CM

## 2021-11-11 MED ORDER — PRALUENT 75 MG/ML ~~LOC~~ SOAJ: SUBCUTANEOUS | 11 refills | Status: DC

## 2021-11-11 NOTE — Progress Notes (Signed)
Kidney and liver look good.  ?Hemoglobin improved.  ?Cholesterol went way up. Goal LDL is below 70 and you are at 138. What changes did you make in the last year. We need to make some adjustments to get back down to goal.  ?A1C 5.7 and still good but up from 6 months ago.

## 2021-11-12 ENCOUNTER — Encounter: Payer: Self-pay | Admitting: Physician Assistant

## 2021-11-12 DIAGNOSIS — E673 Hypervitaminosis D: Secondary | ICD-10-CM

## 2021-11-12 LAB — CBC WITH DIFFERENTIAL/PLATELET
Absolute Monocytes: 436 cells/uL (ref 200–950)
Basophils Absolute: 50 cells/uL (ref 0–200)
Basophils Relative: 0.5 %
Eosinophils Absolute: 99 cells/uL (ref 15–500)
Eosinophils Relative: 1 %
HCT: 46 % — ABNORMAL HIGH (ref 35.0–45.0)
Hemoglobin: 15.2 g/dL (ref 11.7–15.5)
Lymphs Abs: 1554 cells/uL (ref 850–3900)
MCH: 28.6 pg (ref 27.0–33.0)
MCHC: 33 g/dL (ref 32.0–36.0)
MCV: 86.5 fL (ref 80.0–100.0)
MPV: 9.4 fL (ref 7.5–12.5)
Monocytes Relative: 4.4 %
Neutro Abs: 7762 cells/uL (ref 1500–7800)
Neutrophils Relative %: 78.4 %
Platelets: 328 10*3/uL (ref 140–400)
RBC: 5.32 10*6/uL — ABNORMAL HIGH (ref 3.80–5.10)
RDW: 13.5 % (ref 11.0–15.0)
Total Lymphocyte: 15.7 %
WBC: 9.9 10*3/uL (ref 3.8–10.8)

## 2021-11-12 LAB — COMPLETE METABOLIC PANEL WITH GFR
AG Ratio: 2 (calc) (ref 1.0–2.5)
ALT: 11 U/L (ref 6–29)
AST: 15 U/L (ref 10–35)
Albumin: 4.6 g/dL (ref 3.6–5.1)
Alkaline phosphatase (APISO): 62 U/L (ref 37–153)
BUN: 16 mg/dL (ref 7–25)
CO2: 25 mmol/L (ref 20–32)
Calcium: 9.9 mg/dL (ref 8.6–10.4)
Chloride: 102 mmol/L (ref 98–110)
Creat: 0.71 mg/dL (ref 0.50–1.05)
Globulin: 2.3 g/dL (calc) (ref 1.9–3.7)
Glucose, Bld: 98 mg/dL (ref 65–139)
Potassium: 4.9 mmol/L (ref 3.5–5.3)
Sodium: 139 mmol/L (ref 135–146)
Total Bilirubin: 0.6 mg/dL (ref 0.2–1.2)
Total Protein: 6.9 g/dL (ref 6.1–8.1)
eGFR: 95 mL/min/{1.73_m2} (ref >=60)

## 2021-11-12 LAB — LIPID PANEL W/REFLEX DIRECT LDL
Cholesterol: 236 mg/dL — ABNORMAL HIGH (ref <200)
HDL: 69 mg/dL (ref >=50)
LDL Cholesterol (Calc): 138 mg/dL (calc) — ABNORMAL HIGH
Non-HDL Cholesterol (Calc): 167 mg/dL (calc) — ABNORMAL HIGH (ref <130)
Total CHOL/HDL Ratio: 3.4 (calc) (ref <5.0)
Triglycerides: 156 mg/dL — ABNORMAL HIGH (ref <150)

## 2021-11-12 LAB — TSH: TSH: 2.7 mIU/L (ref 0.40–4.50)

## 2021-11-12 LAB — HEMOGLOBIN A1C
Hgb A1c MFr Bld: 5.7 % of total Hgb — ABNORMAL HIGH (ref <5.7)
Mean Plasma Glucose: 117 mg/dL
eAG (mmol/L): 6.5 mmol/L

## 2021-11-12 LAB — VITAMIN D 25 HYDROXY (VIT D DEFICIENCY, FRACTURES): Vit D, 25-Hydroxy: 118 ng/mL — ABNORMAL HIGH (ref 30–100)

## 2021-11-12 LAB — VITAMIN B12: Vitamin B-12: 438 pg/mL (ref 200–1100)

## 2021-11-12 NOTE — Progress Notes (Signed)
Vitamin D is too high. We need to decrease some. Confirm how patient is taking vitamin D.

## 2021-11-12 NOTE — Progress Notes (Signed)
MyChart message sent to pt with results and request to confirm vitamin D dosage.  Charyl Bigger, CMA ? ?

## 2021-11-17 ENCOUNTER — Telehealth: Payer: Self-pay

## 2021-11-17 NOTE — Telephone Encounter (Addendum)
Initiated Prior authorization ODQ:[id number]) 39 MG/ML SOAJ ?Via: Covermymeds ?Case/Key: [id number] ?Status: approved  as of 11/17/21 ?Reason:the request was approved for the following time period: ?11/17/2021 - 11/18/2022 ?Notified Pt via: Mychart ?

## 2021-12-04 ENCOUNTER — Encounter: Payer: Self-pay | Admitting: Physician Assistant

## 2021-12-14 LAB — HM DIABETES EYE EXAM

## 2021-12-24 ENCOUNTER — Other Ambulatory Visit: Payer: Self-pay | Admitting: Neurology

## 2021-12-24 DIAGNOSIS — K219 Gastro-esophageal reflux disease without esophagitis: Secondary | ICD-10-CM

## 2021-12-24 MED ORDER — PANTOPRAZOLE SODIUM 40 MG PO TBEC: DELAYED_RELEASE_TABLET | ORAL | 3 refills | Status: DC

## 2022-02-08 ENCOUNTER — Other Ambulatory Visit: Payer: Self-pay | Admitting: Physician Assistant

## 2022-02-08 DIAGNOSIS — E1169 Type 2 diabetes mellitus with other specified complication: Secondary | ICD-10-CM

## 2022-02-10 ENCOUNTER — Encounter: Payer: Self-pay | Admitting: Physician Assistant

## 2022-02-10 ENCOUNTER — Ambulatory Visit: Payer: BC Managed Care – PPO | Admitting: Physician Assistant

## 2022-02-10 VITALS — BP 124/57 | HR 97 | Ht 62.5 in | Wt 164.0 lb

## 2022-02-10 DIAGNOSIS — I951 Orthostatic hypotension: Secondary | ICD-10-CM | POA: Insufficient documentation

## 2022-02-10 DIAGNOSIS — E1169 Type 2 diabetes mellitus with other specified complication: Secondary | ICD-10-CM

## 2022-02-10 DIAGNOSIS — Z794 Long term (current) use of insulin: Secondary | ICD-10-CM | POA: Diagnosis not present

## 2022-02-10 LAB — POCT GLYCOSYLATED HEMOGLOBIN (HGB A1C): HbA1c, POC (controlled diabetic range): 5.3 % (ref 0.0–7.0)

## 2022-02-10 NOTE — Progress Notes (Signed)
Established Patient Office Visit  Subjective   Patient ID: Tina Mcgrath, female    DOB: [birth year]  Age: 66 y.o. MRN: [id number]  Chief Complaint  Patient presents with   Diabetes    Diabetes   Tina Mcgrath is a 66 year old female with a history of T2DM, Orthostatic Hypotension, Hyperlipidemia, Breast Cancer, and Grief who presents for follow up evaluation of T2DM. She reports that she has been doing well. Fasting glucose has been ranging between 110-120 over the last 3 months. She reports that she had a syncopal event in April that resolved spontaneously. She states that this has happened to her ever since her chemotherapy treatment. She denies any seizure-like activity, falls, or blood glucose fluctuations. Denies any vision changes, cardiac problems, or worsening neuropathy.   .. Active Ambulatory Problems    Diagnosis Date Noted   Hyperlipidemia 08/19/2014   Elevated vitamin B12 level 08/19/2014   Hematuria 08/19/2014   Primary osteoarthritis of left knee 08/19/2014   Dry eyes 08/19/2014   Migraine without aura and with status migrainosus, not intractable 08/19/2014   Mild sleep apnea 08/19/2014   Fecal incontinence 08/19/2014   Elevated triglycerides with high cholesterol 09/03/2014   Diabetes mellitus ([place]) 09/03/2014   Proteinuria 09/10/2014   Chronic cystitis 01/06/2015   Vitamin D deficiency 08/27/2015   History of right breast cancer 11/04/2015   Cough 03/02/2016   Carpal tunnel syndrome on both sides 08/23/2016   Ulnar neuropathy of both upper extremities 08/23/2016   Glaucoma suspect of both eyes 08/25/2016   Elevated liver enzymes 11/28/2016   Onychomycosis 11/28/2016   Fatty liver disease, nonalcoholic 94/76/5465   Hyperlipidemia associated with type 2 diabetes mellitus ([place]) 12/20/2017   Osteoarthritis of right hip 03/23/2018   Trouble in sleeping 06/28/2018   Grief reaction 06/28/2018   Hip flexor tendinitis, right 06/28/2018   Statin intolerance 06/29/2018    Gastroesophageal reflux disease 11/14/2019   Family history of esophageal cancer 12/12/2019   Status post cataract extraction 02/12/2020   Status post right hip replacement 02/12/2020   Leg cramps 11/19/2020   Skin mass 05/20/2021   Overweight (BMI 25.0-29.9) 05/20/2021   Orthostatic hypotension 02/10/2022   Resolved Ambulatory Problems    Diagnosis Date Noted   Impaired fasting glucose 08/19/2014   Breast cancer ([place]) 11/13/2014   Elevated blood pressure 11/13/2014   Post-viral cough syndrome 11/04/2015   Prediabetes 11/21/2015   Class 1 obesity due to excess calories with serious comorbidity and body mass index (BMI) of 31.0 to 31.9 in adult 03/02/2016   Right groin pain 03/23/2018   Past Medical History:  Diagnosis Date   Cancer [facility])    History of cancer of right breast 11/04/2015   HSV-2 (herpes simplex virus 2) infection    Migraines    Obesity 03/02/2016   Osteoarthritis    Rosacea    Sleep apnea      Review of Systems  All other systems reviewed and are negative.    Objective:     BP (!) 124/57   Pulse 97   Ht 5' 2.5" (1.588 m)   Wt 74.4 kg   SpO2 100%   BMI 29.52 kg/m  BP Readings from Last 3 Encounters:  02/10/22 (!) 124/57  11/10/21 137/76  08/12/21 134/75   Wt Readings from Last 3 Encounters:  02/10/22 164 lb (74.4 kg)  11/10/21 160 lb (72.6 kg)  08/12/21 158 lb (71.7 kg)      Physical Exam Vitals reviewed.  Constitutional:  Appearance: She is obese.  Eyes:     Extraocular Movements: Extraocular movements intact.     Conjunctiva/sclera: Conjunctivae normal.  Cardiovascular:     Rate and Rhythm: Normal rate and regular rhythm.     Pulses: Normal pulses.     Heart sounds: Normal heart sounds.  Pulmonary:     Effort: Pulmonary effort is normal.     Breath sounds: Normal breath sounds.  Skin:    General: Skin is warm and dry.     Capillary Refill: Capillary refill takes less than 2 seconds.  Neurological:     Mental Status: She  is alert and oriented to person, place, and time.  Psychiatric:        Mood and Affect: Mood normal.        Behavior: Behavior normal.     Results for orders placed or performed in visit on 02/10/22  POCT glycosylated hemoglobin (Hb A1C)  Result Value Ref Range   Hemoglobin A1C     HbA1c POC (<> result, manual entry)     HbA1c, POC (prediabetic range)     HbA1c, POC (controlled diabetic range) 5.3 0.0 - 7.0 %  HM DIABETES EYE EXAM  Result Value Ref Range   HM Diabetic Eye Exam No Retinopathy No Retinopathy       Assessment & Plan:    [name] was seen today for diabetes.  Diagnoses and all orders for this visit:  Type 2 diabetes mellitus with other specified complication, with long-term current use of insulin (HCC) -     POCT glycosylated hemoglobin (Hb A1C)  Orthostatic hypotension   A1C looks great Continue on same medications BP to goal not on ace/arb Microalbumin normal On praulent, statin intolerant Eye and foot exam UTD Declined covid vaccine Declined flu, pneumonia, shingrix. Follow upin 3 months  Discussed BP Stay hydrated and eat salt  Return in about 3 months (around 05/13/2022).

## 2022-02-10 NOTE — Progress Notes (Signed)
One episode of vasovagal spasm Passed out - when awoke bp 85/56 hr 82 Had some salt - bp 113/70 hr 88

## 2022-02-12 ENCOUNTER — Other Ambulatory Visit: Payer: Self-pay | Admitting: Neurology

## 2022-02-12 DIAGNOSIS — E1169 Type 2 diabetes mellitus with other specified complication: Secondary | ICD-10-CM

## 2022-02-12 MED ORDER — OZEMPIC (1 MG/DOSE) 4 MG/3ML ~~LOC~~ SOPN: 1.0000 mg | PEN_INJECTOR | SUBCUTANEOUS | 2 refills | Status: DC

## 2022-03-01 ENCOUNTER — Encounter: Payer: Self-pay | Admitting: Physician Assistant

## 2022-03-20 ENCOUNTER — Other Ambulatory Visit: Payer: Self-pay | Admitting: Physician Assistant

## 2022-05-14 ENCOUNTER — Encounter: Payer: Self-pay | Admitting: Physician Assistant

## 2022-05-14 ENCOUNTER — Ambulatory Visit: Payer: BC Managed Care – PPO | Admitting: Physician Assistant

## 2022-05-14 VITALS — BP 131/61 | HR 106 | Ht 62.5 in | Wt 160.0 lb

## 2022-05-14 DIAGNOSIS — Z794 Long term (current) use of insulin: Secondary | ICD-10-CM | POA: Diagnosis not present

## 2022-05-14 DIAGNOSIS — E1169 Type 2 diabetes mellitus with other specified complication: Secondary | ICD-10-CM

## 2022-05-14 DIAGNOSIS — J302 Other seasonal allergic rhinitis: Secondary | ICD-10-CM

## 2022-05-14 DIAGNOSIS — Z1231 Encounter for screening mammogram for malignant neoplasm of breast: Secondary | ICD-10-CM

## 2022-05-14 LAB — POCT GLYCOSYLATED HEMOGLOBIN (HGB A1C): Hemoglobin A1C: 5.5 % (ref 4.0–5.6)

## 2022-05-14 LAB — POCT UA - MICROALBUMIN
Albumin/Creatinine Ratio, Urine, POC: <30
Creatinine, POC: 200 mg/dL
Microalbumin Ur, POC: 30 mg/L

## 2022-05-14 MED ORDER — ALBUTEROL SULFATE HFA 108 (90 BASE) MCG/ACT IN AERS: 2.0000 | INHALATION_SPRAY | Freq: Four times a day (QID) | RESPIRATORY_TRACT | 0 refills | Status: DC | PRN

## 2022-05-14 MED ORDER — METFORMIN HCL ER 750 MG PO TB24: ORAL_TABLET | ORAL | 1 refills | Status: DC

## 2022-05-14 NOTE — Progress Notes (Signed)
Established Patient Office Visit  Subjective   Patient ID: Tina Mcgrath, female    DOB: 02/28/1956  Age: 66 y.o. MRN: [id number]  Chief Complaint  Patient presents with   Diabetes    HPI  66 y/o woman presents to clinic today for T2DM f/u and is doing well. Pt has been checking her AM fasting sugars w/ average of 108.97 over past 3 months. She is compliant with her ozempic weekly and metformin daily. She is staying pretty active. She did have a fall while trying to pull down a limb in a tree. Overall she has no concerns. She would like albuterol refill to get her through her allergies to ragweed.   .. Active Ambulatory Problems    Diagnosis Date Noted   Hyperlipidemia 08/19/2014   Elevated vitamin B12 level 08/19/2014   Hematuria 08/19/2014   Primary osteoarthritis of left knee 08/19/2014   Dry eyes 08/19/2014   Migraine without aura and with status migrainosus, not intractable 08/19/2014   Mild sleep apnea 08/19/2014   Fecal incontinence 08/19/2014   Elevated triglycerides with high cholesterol 09/03/2014   Diabetes mellitus ([place]) 09/03/2014   Proteinuria 09/10/2014   Chronic cystitis 01/06/2015   Vitamin D deficiency 08/27/2015   History of right breast cancer 11/04/2015   Cough 03/02/2016   Carpal tunnel syndrome on both sides 08/23/2016   Ulnar neuropathy of both upper extremities 08/23/2016   Glaucoma suspect of both eyes 08/25/2016   Elevated liver enzymes 11/28/2016   Onychomycosis 11/28/2016   Fatty liver disease, nonalcoholic 13/04/6577   Hyperlipidemia associated with type 2 diabetes mellitus ([place]) 12/20/2017   Osteoarthritis of right hip 03/23/2018   Trouble in sleeping 06/28/2018   Grief reaction 06/28/2018   Hip flexor tendinitis, right 06/28/2018   Statin intolerance 06/29/2018   Gastroesophageal reflux disease 11/14/2019   Family history of esophageal cancer 12/12/2019   Status post cataract extraction 02/12/2020   Status post right hip replacement  02/12/2020   Leg cramps 11/19/2020   Skin mass 05/20/2021   Overweight (BMI 25.0-29.9) 05/20/2021   Orthostatic hypotension 02/10/2022   Resolved Ambulatory Problems    Diagnosis Date Noted   Impaired fasting glucose 08/19/2014   Breast cancer ([place]) 11/13/2014   Elevated blood pressure 11/13/2014   Post-viral cough syndrome 11/04/2015   Prediabetes 11/21/2015   Class 1 obesity due to excess calories with serious comorbidity and body mass index (BMI) of 31.0 to 31.9 in adult 03/02/2016   Right groin pain 03/23/2018   Past Medical History:  Diagnosis Date   Cancer [facility])    History of cancer of right breast 11/04/2015   HSV-2 (herpes simplex virus 2) infection    Migraines    Obesity 03/02/2016   Osteoarthritis    Rosacea    Sleep apnea      ROS Pt denies foot ulcers, hypoglycemic episodes, CP, SOB, fatigue, numbness, tingling, urinary changes, mood changes, abdominal pain.   Objective:     BP 131/61   Pulse (!) 106   Ht 5' 2.5" (1.588 m)   Wt 160 lb (72.6 kg)   SpO2 100%   BMI 28.80 kg/m  BP Readings from Last 3 Encounters:  05/14/22 131/61  02/10/22 (!) 124/57  11/10/21 137/76   Wt Readings from Last 3 Encounters:  05/14/22 160 lb (72.6 kg)  02/10/22 164 lb (74.4 kg)  11/10/21 160 lb (72.6 kg)      Physical Exam Constitutional:      Appearance: Normal appearance.  HENT:  Head: Normocephalic.  Neck:     Vascular: No carotid bruit.  Cardiovascular:     Rate and Rhythm: Normal rate.     Pulses: Normal pulses.     Heart sounds: Normal heart sounds.  Pulmonary:     Effort: Pulmonary effort is normal.     Breath sounds: Normal breath sounds.  Musculoskeletal:     Right lower leg: No edema.     Left lower leg: No edema.  Neurological:     General: No focal deficit present.     Mental Status: She is alert and oriented to person, place, and time.  Psychiatric:        Mood and Affect: Mood normal.      Results for orders placed or performed in  visit on 05/14/22  POCT glycosylated hemoglobin (Hb A1C)  Result Value Ref Range   Hemoglobin A1C 5.5 4.0 - 5.6 %   HbA1c POC (<> result, manual entry)     HbA1c, POC (prediabetic range)     HbA1c, POC (controlled diabetic range)    POCT UA - Microalbumin  Result Value Ref Range   Microalbumin Ur, POC 30 mg/L   Creatinine, POC 200 mg/dL   Albumin/Creatinine Ratio, Urine, POC <30          Assessment & Plan:  Tina Mcgrath KitchenMarland KitchenDanijah was seen today for diabetes.  Diagnoses and all orders for this visit:  Type 2 diabetes mellitus with other specified complication, with long-term current use of insulin (HCC) -     POCT glycosylated hemoglobin (Hb A1C) -     POCT UA - Microalbumin -     metFORMIN (GLUCOPHAGE-XR) 750 MG 24 hr tablet; TAKE 1 TABLET(750 MG) BY MOUTH DAILY WITH BREAKFAST  Encounter for screening mammogram for malignant neoplasm of breast -     MM 3D SCREEN BREAST BILATERAL  Seasonal allergies -     albuterol (VENTOLIN HFA) 108 (90 Base) MCG/ACT inhaler; Inhale 2 puffs into the lungs every 6 (six) hours as needed.   A1C controlled Continue on same meds BP very close to goal, not on ACE or ARB Cannot tolerate statin, on praluent No protein in urine Foot and eye exam UTD.  Declined covid and flu vaccine A1C very controlled follow up in 6 months.   Albuterol for as needed use for SOB/cough during allergy season.   MM ordered to have done at [facility].    Return in about 6 months (around 11/12/2022).    Iran Planas, PA-C

## 2022-05-31 ENCOUNTER — Other Ambulatory Visit: Payer: Self-pay | Admitting: Physician Assistant

## 2022-05-31 DIAGNOSIS — E1169 Type 2 diabetes mellitus with other specified complication: Secondary | ICD-10-CM

## 2022-07-26 LAB — HM MAMMOGRAPHY

## 2022-08-10 ENCOUNTER — Encounter: Payer: Self-pay | Admitting: Physician Assistant

## 2022-08-13 ENCOUNTER — Encounter: Payer: Self-pay | Admitting: Physician Assistant

## 2022-08-16 MED ORDER — REPATHA SURECLICK 140 MG/ML ~~LOC~~ SOAJ: 140.0000 mg | SUBCUTANEOUS | 3 refills | Status: DC

## 2022-08-18 ENCOUNTER — Telehealth: Payer: Self-pay

## 2022-08-18 NOTE — Telephone Encounter (Addendum)
Initiated Prior authorization [id number] SureClick '140MG'$ /ML auto-injectors Via: Covermymeds Case/Key:[id number] Status: denied  as of 08/18/22 Reason:Your plan only covers this drug when you meet one of these options: A) You have tried one other drug your plan covers (preferred drug), and it did not work well for you, or B) Your doctor gives [place] a medical reason you cannot take the preferred drug. For your plan, you may need to try only th e preferred drug. We have denied your request because you do not meet any of these conditions. Your records must be provided and must show what your doctor tells [place]. We reviewed the information we had. Your request has been denied. Your doctor can send [place] any new or missing information for [place] to review. The preferred drug for your plan is Praluent. Your doctor may need to get approval from your plan for preferred drugs. Additional coverage criteria may apply, please review policy or plan documents for full requirements. Notified Pt via: Mychart

## 2022-08-18 NOTE — Telephone Encounter (Incomplete Revision)
Initiated Prior authorization [id number] SureClick '140MG'$ /ML auto-injectors Via: Covermymeds Case/Key:[id number] Status: denied  as of 08/18/22 Reason:Your plan only covers this drug when you meet one of these options: A) You have tried one other drug your plan covers (preferred drug), and it did not work well for you, or B) Your doctor gives [place] a medical reason you cannot take the preferred drug. For your plan, you may need to try only th e preferred drug. We have denied your request because you do not meet any of these conditions. Your records must be provided and must show what your doctor tells [place]. We reviewed the information we had. Your request has been denied. Your doctor can send [place] any new or missing information for [place] to review. The preferred drug for your plan is Praluent. Your doctor may need to get approval from your plan for preferred drugs. Additional coverage criteria may apply, please review policy or plan documents for full requirements. Notified Pt via: Mychart

## 2022-08-24 ENCOUNTER — Telehealth: Payer: Self-pay

## 2022-09-06 ENCOUNTER — Other Ambulatory Visit: Payer: Self-pay | Admitting: Physician Assistant

## 2022-09-06 DIAGNOSIS — Z794 Long term (current) use of insulin: Secondary | ICD-10-CM

## 2022-09-06 DIAGNOSIS — E1169 Type 2 diabetes mellitus with other specified complication: Secondary | ICD-10-CM

## 2022-09-08 ENCOUNTER — Other Ambulatory Visit: Payer: Self-pay | Admitting: Physician Assistant

## 2022-09-08 DIAGNOSIS — E1169 Type 2 diabetes mellitus with other specified complication: Secondary | ICD-10-CM

## 2022-09-10 ENCOUNTER — Encounter: Payer: Self-pay | Admitting: Physician Assistant

## 2022-09-10 MED ORDER — ERYTHROMYCIN 5 MG/GM OP OINT: 1.0000 | TOPICAL_OINTMENT | Freq: Three times a day (TID) | OPHTHALMIC | 0 refills | Status: AC

## 2022-10-13 ENCOUNTER — Other Ambulatory Visit: Payer: Self-pay

## 2022-10-13 DIAGNOSIS — K219 Gastro-esophageal reflux disease without esophagitis: Secondary | ICD-10-CM

## 2022-10-13 MED ORDER — PANTOPRAZOLE SODIUM 40 MG PO TBEC: DELAYED_RELEASE_TABLET | ORAL | 3 refills | Status: DC

## 2022-10-18 ENCOUNTER — Other Ambulatory Visit: Payer: Self-pay | Admitting: Neurology

## 2022-10-18 DIAGNOSIS — E1169 Type 2 diabetes mellitus with other specified complication: Secondary | ICD-10-CM

## 2022-10-18 MED ORDER — PRALUENT 75 MG/ML ~~LOC~~ SOAJ: SUBCUTANEOUS | 11 refills | Status: DC

## 2022-10-22 NOTE — Telephone Encounter (Signed)
Done

## 2022-11-12 ENCOUNTER — Ambulatory Visit: Payer: BC Managed Care – PPO | Admitting: Physician Assistant

## 2022-11-15 ENCOUNTER — Ambulatory Visit (INDEPENDENT_AMBULATORY_CARE_PROVIDER_SITE_OTHER): Payer: BC Managed Care – PPO | Admitting: Physician Assistant

## 2022-11-15 ENCOUNTER — Encounter: Payer: Self-pay | Admitting: Physician Assistant

## 2022-11-15 VITALS — BP 138/88 | HR 105 | Ht 62.5 in | Wt 164.0 lb

## 2022-11-15 DIAGNOSIS — E1169 Type 2 diabetes mellitus with other specified complication: Secondary | ICD-10-CM

## 2022-11-15 DIAGNOSIS — Z794 Long term (current) use of insulin: Secondary | ICD-10-CM | POA: Diagnosis not present

## 2022-11-15 DIAGNOSIS — E559 Vitamin D deficiency, unspecified: Secondary | ICD-10-CM

## 2022-11-15 DIAGNOSIS — Z1329 Encounter for screening for other suspected endocrine disorder: Secondary | ICD-10-CM | POA: Diagnosis not present

## 2022-11-15 DIAGNOSIS — G5603 Carpal tunnel syndrome, bilateral upper limbs: Secondary | ICD-10-CM

## 2022-11-15 DIAGNOSIS — Z79899 Other long term (current) drug therapy: Secondary | ICD-10-CM | POA: Diagnosis not present

## 2022-11-15 DIAGNOSIS — E785 Hyperlipidemia, unspecified: Secondary | ICD-10-CM

## 2022-11-15 DIAGNOSIS — K219 Gastro-esophageal reflux disease without esophagitis: Secondary | ICD-10-CM

## 2022-11-15 DIAGNOSIS — R748 Abnormal levels of other serum enzymes: Secondary | ICD-10-CM | POA: Diagnosis not present

## 2022-11-15 LAB — POCT GLYCOSYLATED HEMOGLOBIN (HGB A1C): Hemoglobin A1C: 5.5 % (ref 4.0–5.6)

## 2022-11-15 MED ORDER — GABAPENTIN 100 MG PO CAPS: 100.0000 mg | ORAL_CAPSULE | Freq: Two times a day (BID) | ORAL | Status: DC

## 2022-11-15 MED ORDER — PANTOPRAZOLE SODIUM 40 MG PO TBEC: 40.0000 mg | DELAYED_RELEASE_TABLET | Freq: Every day | ORAL | Status: DC

## 2022-11-15 NOTE — Progress Notes (Signed)
Established Patient Office Visit  Subjective   Patient ID: Tina Mcgrath, female    DOB: 1955-09-14  Age: 67 y.o. MRN: [id number]  Chief Complaint  Patient presents with   Follow-up   Diabetes    HPI Patient is a 67 year old obese female with type 2 diabetes, hyperlipidemia, GERD, vitamin D deficiency who presents to the clinic for 81-monthfollow-up.  Patient is checking her sugars very regularly and comes in with log.  Her average sugars are 90 9 in the morning.  She does have a range of about 86 through 120.  She denies any hypoglycemic events.  She denies any open sores or wounds.  She denies any chest pain, palpitations, headaches or vision changes.  Patient is very compliant with her medication and takes Ozempic weekly.  Patient is taking Protonix once a day for GERD.  This is a decrease from twice a day.  Patient has tried to decrease her gabapentin and done well at twice a day.  Her vitamin D has stayed at 5000 units daily.   .. Active Ambulatory Problems    Diagnosis Date Noted   Hyperlipidemia 08/19/2014   Elevated vitamin B12 level 08/19/2014   Hematuria 08/19/2014   Primary osteoarthritis of left knee 08/19/2014   Dry eyes 08/19/2014   Migraine without aura and with status migrainosus, not intractable 08/19/2014   Mild sleep apnea 08/19/2014   Fecal incontinence 08/19/2014   Elevated triglycerides with high cholesterol 09/03/2014   Diabetes mellitus ([place] 09/03/2014   Proteinuria 09/10/2014   Chronic cystitis 01/06/2015   Vitamin D deficiency 08/27/2015   History of right breast cancer 11/04/2015   Cough 03/02/2016   Carpal tunnel syndrome on both sides 08/23/2016   Ulnar neuropathy of both upper extremities 08/23/2016   Glaucoma suspect of both eyes 08/25/2016   Elevated liver enzymes 11/28/2016   Onychomycosis 11/28/2016   Fatty liver disease, nonalcoholic [id number]  Hyperlipidemia associated with type 2 diabetes mellitus ([place] 12/20/2017   Osteoarthritis  of right hip 03/23/2018   Trouble in sleeping 06/28/2018   Grief reaction 06/28/2018   Hip flexor tendinitis, right 06/28/2018   Statin intolerance 06/29/2018   Gastroesophageal reflux disease 11/14/2019   Family history of esophageal cancer 12/12/2019   Status post cataract extraction 02/12/2020   Status post right hip replacement 02/12/2020   Leg cramps 11/19/2020   Skin mass 05/20/2021   Overweight (BMI 25.0-29.9) 05/20/2021   Orthostatic hypotension 02/10/2022   Resolved Ambulatory Problems    Diagnosis Date Noted   Impaired fasting glucose 08/19/2014   Breast cancer ([facility] 11/13/2014   Elevated blood pressure 11/13/2014   Post-viral cough syndrome 11/04/2015   Prediabetes 11/21/2015   Class 1 obesity due to excess calories with serious comorbidity and body mass index (BMI) of 31.0 to 31.9 in adult 03/02/2016   Right groin pain 03/23/2018   Past Medical History:  Diagnosis Date   Cancer ([facility]    History of cancer of right breast 11/04/2015   HSV-2 (herpes simplex virus 2) infection    Migraines    Obesity 03/02/2016   Osteoarthritis    Rosacea    Sleep apnea     ROS   See HPI.    Objective:     BP 138/88   Pulse (!) 105   Ht 5' 2.5" (1.588 m)   Wt 164 lb (74.4 kg)   SpO2 99%   BMI 29.52 kg/m  BP Readings from Last 3 Encounters:  11/15/22 138/88  05/14/22 131/61  02/10/22 (!) 124/57   Wt Readings from Last 3 Encounters:  11/15/22 164 lb (74.4 kg)  05/14/22 160 lb (72.6 kg)  02/10/22 164 lb (74.4 kg)    ..    11/15/2022    9:18 AM 05/14/2022    9:20 AM 02/10/2022    9:13 AM 11/10/2021    9:21 AM 08/12/2021    9:59 AM  Depression screen PHQ 2/9  Decreased Interest 0 0 0 0 0  Down, Depressed, Hopeless 0 0 0 0 1  PHQ - 2 Score 0 0 0 0 1  Altered sleeping     3  Tired, decreased energy     0  Change in appetite     0  Feeling bad or failure about yourself      0  Trouble concentrating     0  Moving slowly or fidgety/restless     0  Suicidal thoughts      0  PHQ-9 Score     4  Difficult doing work/chores     Not difficult at all       Physical Exam Vitals reviewed.  Constitutional:      Appearance: Normal appearance. She is obese.  HENT:     Head: Normocephalic.  Cardiovascular:     Rate and Rhythm: Normal rate and regular rhythm.  Pulmonary:     Effort: Pulmonary effort is normal.     Breath sounds: Normal breath sounds.  Musculoskeletal:     Cervical back: Normal range of motion.     Right lower leg: No edema.     Left lower leg: No edema.  Neurological:     General: No focal deficit present.     Mental Status: She is alert and oriented to person, place, and time.  Psychiatric:        Mood and Affect: Mood normal.      Results for orders placed or performed in visit on 11/15/22  POCT glycosylated hemoglobin (Hb A1C)  Result Value Ref Range   Hemoglobin A1C 5.5 4.0 - 5.6 %   HbA1c POC (<> result, manual entry)     HbA1c, POC (prediabetic range)     HbA1c, POC (controlled diabetic range)       The 10-year ASCVD risk score (Arnett DK, et al., 2019) is: 10.8%    Assessment & Plan:  [name] KitchenMarland KitchenAhyana was seen today for follow-up and diabetes.  Diagnoses and all orders for this visit:  Type 2 diabetes mellitus with other specified complication, with long-term current use of insulin (HCC) -     POCT glycosylated hemoglobin (Hb A1C) -     COMPLETE METABOLIC PANEL WITH GFR -     Urine Microalbumin w/creat. ratio  Hyperlipidemia associated with type 2 diabetes mellitus (HCC) -     Lipid Panel w/reflex Direct LDL  Vitamin D deficiency -     Vitamin D (25 hydroxy)  Elevated vitamin B12 level -     Vitamin B12  Thyroid disorder screen -     TSH  Medication management -     POCT glycosylated hemoglobin (Hb A1C) -     TSH -     Lipid Panel w/reflex Direct LDL -     COMPLETE METABOLIC PANEL WITH GFR -     CBC with Differential/Platelet -     Vitamin D (25 hydroxy) -     Vitamin B12 -     Urine Microalbumin w/creat.  ratio  Gastroesophageal reflux disease without esophagitis -  pantoprazole (PROTONIX) 40 MG tablet; Take 1 tablet (40 mg total) by mouth daily.  Carpal tunnel syndrome on both sides -     gabapentin (NEURONTIN) 100 MG capsule; Take 1 capsule (100 mg total) by mouth 2 (two) times daily.    A1c today is 5.5 and very controlled Patient will remain on Ozempic 1 mg weekly Blood pressure to goal Patient is still on Praluent for cholesterol, lipid recheck today .[name] Diabetic Foot Exam - Simple   Simple Foot Form Diabetic Foot exam was performed with the following findings: Yes 11/15/2022  9:07 AM  Visual Inspection No deformities, no ulcerations, no other skin breakdown bilaterally: Yes Sensation Testing Intact to touch and monofilament testing bilaterally: Yes Pulse Check Posterior Tibialis and Dorsalis pulse intact bilaterally: Yes Comments    No changes in foot exam today Patient declines flu, pneumonia, COVID vaccines today Follow-up in 6 months  Reviewed med list with patient and made changes as she is taking medications.  Iran Planas, PA-C

## 2022-11-16 LAB — CBC WITH DIFFERENTIAL/PLATELET
Absolute Monocytes: 403 cells/uL (ref 200–950)
Basophils Absolute: 50 cells/uL (ref 0–200)
Basophils Relative: 0.8 %
Eosinophils Absolute: 82 cells/uL (ref 15–500)
Eosinophils Relative: 1.3 %
HCT: 45.8 % — ABNORMAL HIGH (ref 35.0–45.0)
Hemoglobin: 15.6 g/dL — ABNORMAL HIGH (ref 11.7–15.5)
Lymphs Abs: 1651 cells/uL (ref 850–3900)
MCH: 29.3 pg (ref 27.0–33.0)
MCHC: 34.1 g/dL (ref 32.0–36.0)
MCV: 86.1 fL (ref 80.0–100.0)
MPV: 9.6 fL (ref 7.5–12.5)
Monocytes Relative: 6.4 %
Neutro Abs: 4114 cells/uL (ref 1500–7800)
Neutrophils Relative %: 65.3 %
Platelets: 302 10*3/uL (ref 140–400)
RBC: 5.32 10*6/uL — ABNORMAL HIGH (ref 3.80–5.10)
RDW: 13.3 % (ref 11.0–15.0)
Total Lymphocyte: 26.2 %
WBC: 6.3 10*3/uL (ref 3.8–10.8)

## 2022-11-16 LAB — COMPLETE METABOLIC PANEL WITH GFR
AG Ratio: 1.6 (calc) (ref 1.0–2.5)
ALT: 12 U/L (ref 6–29)
AST: 18 U/L (ref 10–35)
Albumin: 4.4 g/dL (ref 3.6–5.1)
Alkaline phosphatase (APISO): 58 U/L (ref 37–153)
BUN: 11 mg/dL (ref 7–25)
CO2: 23 mmol/L (ref 20–32)
Calcium: 9.3 mg/dL (ref 8.6–10.4)
Chloride: 105 mmol/L (ref 98–110)
Creat: 0.58 mg/dL (ref 0.50–1.05)
Globulin: 2.7 g/dL (calc) (ref 1.9–3.7)
Glucose, Bld: 92 mg/dL (ref 65–99)
Potassium: 4.2 mmol/L (ref 3.5–5.3)
Sodium: 140 mmol/L (ref 135–146)
Total Bilirubin: 0.5 mg/dL (ref 0.2–1.2)
Total Protein: 7.1 g/dL (ref 6.1–8.1)
eGFR: 101 mL/min/{1.73_m2} (ref >=60)

## 2022-11-16 LAB — MICROALBUMIN / CREATININE URINE RATIO
Creatinine, Urine: 37 mg/dL (ref 20–275)
Microalb Creat Ratio: 24 mcg/mg creat (ref <30)
Microalb, Ur: 0.9 mg/dL

## 2022-11-16 LAB — VITAMIN B12: Vitamin B-12: 400 pg/mL (ref 200–1100)

## 2022-11-16 LAB — LIPID PANEL W/REFLEX DIRECT LDL
Cholesterol: 157 mg/dL (ref <200)
HDL: 73 mg/dL (ref >=50)
LDL Cholesterol (Calc): 61 mg/dL (calc)
Non-HDL Cholesterol (Calc): 84 mg/dL (calc) (ref <130)
Total CHOL/HDL Ratio: 2.2 (calc) (ref <5.0)
Triglycerides: 155 mg/dL — ABNORMAL HIGH (ref <150)

## 2022-11-16 LAB — TSH: TSH: 3.65 mIU/L (ref 0.40–4.50)

## 2022-11-16 LAB — VITAMIN D 25 HYDROXY (VIT D DEFICIENCY, FRACTURES): Vit D, 25-Hydroxy: 98 ng/mL (ref 30–100)

## 2022-11-16 NOTE — Progress Notes (Signed)
Rickell,   Your LDL looks GREAT.  Kidney, liver, glucose look wonderful.  Vitamin D in normal range now.  B12 look good.  Normal microalbumin.

## 2022-11-26 ENCOUNTER — Other Ambulatory Visit: Payer: Self-pay | Admitting: Physician Assistant

## 2022-11-26 DIAGNOSIS — Z794 Long term (current) use of insulin: Secondary | ICD-10-CM

## 2022-11-29 ENCOUNTER — Other Ambulatory Visit: Payer: Self-pay | Admitting: Neurology

## 2022-11-29 DIAGNOSIS — G5603 Carpal tunnel syndrome, bilateral upper limbs: Secondary | ICD-10-CM

## 2022-11-29 MED ORDER — GABAPENTIN 100 MG PO CAPS: 100.0000 mg | ORAL_CAPSULE | Freq: Three times a day (TID) | ORAL | 1 refills | Status: DC

## 2022-11-30 ENCOUNTER — Encounter: Payer: Self-pay | Admitting: Physician Assistant

## 2022-12-01 ENCOUNTER — Telehealth: Payer: Self-pay

## 2022-12-01 MED ORDER — REPATHA SURECLICK 140 MG/ML ~~LOC~~ SOAJ: 140.0000 mg | SUBCUTANEOUS | 3 refills | Status: DC

## 2022-12-01 NOTE — Telephone Encounter (Signed)
[facility] called in reference to a pa on Alirocumab 75mg  SOAJ the reference number is [phone] please contact [phone]

## 2022-12-06 NOTE — Telephone Encounter (Signed)
[facility] called again today about pa on olocumab (REPATHA SURECLICK) [id number] MG/ML SOAJ and also Alirocumab (PRALUENT) 75 MG/ML SOAJ  [id number] - [place], [place] - [address] [place] [address]

## 2022-12-07 ENCOUNTER — Telehealth: Payer: Self-pay

## 2022-12-07 NOTE — Telephone Encounter (Signed)
Initiated Prior authorization [id number] SureClick 140MG /ML auto-injectors Via: Covermymeds Case/Key:[id number] Status: Pending as of 12/07/22 Reason: Notified Pt via: Mychart

## 2022-12-14 ENCOUNTER — Encounter: Payer: Self-pay | Admitting: Physician Assistant

## 2022-12-14 ENCOUNTER — Other Ambulatory Visit: Payer: Self-pay | Admitting: Pharmacist

## 2022-12-14 DIAGNOSIS — G720 Drug-induced myopathy: Secondary | ICD-10-CM

## 2022-12-14 NOTE — Progress Notes (Signed)
Care Coordination Call   Outreached patient in response to request from PCP to investigate prior auth denial for patient's repatha. Denial received due to LDL chart notes not attached to submission. Was able to resubmit prior auth for review, attached 11/15/22 labwork to prior auth. It is pending review.   Also related to quality metric reporting, will facilitate for provider to consider G72 diagnosis code to exclude patient from failing metric of statin use in persons with diabetes (SUPD) due to statin myopathy.   Prior auth:  Coralyn Mark (Key: Everlena Cooper) Repatha SureClick 140MG /ML auto-injectors  Lynnda Shields, PharmD, BCPS Clinical Pharmacist [facility]

## 2022-12-31 DIAGNOSIS — G720 Drug-induced myopathy: Secondary | ICD-10-CM | POA: Insufficient documentation

## 2023-01-06 ENCOUNTER — Encounter: Payer: Self-pay | Admitting: Physician Assistant

## 2023-01-07 MED ORDER — EZETIMIBE 10 MG PO TABS: 10.0000 mg | ORAL_TABLET | Freq: Every day | ORAL | 3 refills | Status: DC

## 2023-02-14 ENCOUNTER — Encounter: Payer: Self-pay | Admitting: Physician Assistant

## 2023-02-14 ENCOUNTER — Ambulatory Visit (INDEPENDENT_AMBULATORY_CARE_PROVIDER_SITE_OTHER): Payer: BC Managed Care – PPO | Admitting: Physician Assistant

## 2023-02-14 VITALS — BP 131/70 | HR 78 | Ht 62.0 in | Wt 170.2 lb

## 2023-02-14 DIAGNOSIS — I878 Other specified disorders of veins: Secondary | ICD-10-CM | POA: Diagnosis not present

## 2023-02-14 NOTE — Progress Notes (Unsigned)
Acute Office Visit  Subjective:     Patient ID: Tina Mcgrath, female    DOB: 1956/01/26, 67 y.o.   MRN: [id number]  Chief Complaint  Patient presents with   buldging vein Right side of forehead above temproal vein.    Patient states noticed at 8:30 pm last night patient had buldging vein Right side forehead above temporal vein . Patient has forwarded pictures of site in Mychart.  At time of appointment she states the vein is now back to normal.    Tick Removal    Patient also wanted to let you know about a tick she removed from her back on 5/ 27/24.    Patient presents with a swollen, clearly defined vein located above her R temple along her hairline. This appeared last night at 830pm when she was sitting on the couch. By 1145pm, it had lost shape and become a circular bulge. She took her BP at this time and reports that it was normal. She iced the area for 15 minutes which slightly reduced the size. This am the circular bulge was back to the original size. Today it has almost returned to normal. She reports an accompanying feeling of pressure behind the area and a "heaviness" of her R eye. She denies HA, dizziness, tenderness, erythema, jaw pain, vision changes. She denies head trauma, medication changes, and stressors.     Review of Systems  Constitutional:  Negative for chills, fever and malaise/fatigue.  HENT:  Negative for ear discharge and ear pain.   Eyes:  Negative for blurred vision, double vision, pain and redness.  Skin:  Negative for itching and rash.  Neurological:  Negative for dizziness, tingling, sensory change, weakness and headaches.        Objective:    BP 131/70   Pulse 78   Ht 5\' 2"  (1.575 m)   Wt 77.2 kg   SpO2 100%   BMI 31.14 kg/m  {Vitals History (Optional):23777}  Physical Exam Constitutional:      Appearance: Normal appearance.  HENT:     Head: Normocephalic and atraumatic.     Right Ear: Tympanic membrane, ear canal and external ear normal.      Left Ear: Tympanic membrane, ear canal and external ear normal.  Eyes:     Extraocular Movements: Extraocular movements intact.     Conjunctiva/sclera: Conjunctivae normal.     Pupils: Pupils are equal, round, and reactive to light.  Neck:     Vascular: No carotid bruit.  Cardiovascular:     Rate and Rhythm: Normal rate and regular rhythm.     Pulses: Normal pulses.     Heart sounds: Normal heart sounds.  Pulmonary:     Effort: Pulmonary effort is normal.     Breath sounds: Normal breath sounds.  Skin:    Findings: No bruising, erythema, lesion or rash.     Comments: Small, skin colored bump (pencil eraser sized) on R hairline above temple on lateral forehead. Non-TTP. Ear fullness is reported when pressed on.  Neurological:     General: No focal deficit present.     Mental Status: She is alert and oriented to person, place, and time.     No results found for any visits on 02/14/23.      Assessment & Plan:   Problem List Items Addressed This Visit   None Visit Diagnoses     Prominent vein    -  Primary   Relevant Orders   Sed Rate (ESR)  C-reactive protein   CBC w/Diff/Platelet       No orders of the defined types were placed in this encounter.   No follow-ups on file.  [employer], [address]

## 2023-02-15 ENCOUNTER — Encounter: Payer: Self-pay | Admitting: Physician Assistant

## 2023-02-15 DIAGNOSIS — I878 Other specified disorders of veins: Secondary | ICD-10-CM | POA: Insufficient documentation

## 2023-02-15 LAB — C-REACTIVE PROTEIN: CRP: <3 mg/L (ref <8.0)

## 2023-02-15 LAB — CBC WITH DIFFERENTIAL/PLATELET
Absolute Monocytes: 482 cells/uL (ref 200–950)
Basophils Absolute: 40 cells/uL (ref 0–200)
Basophils Relative: 0.6 %
Eosinophils Absolute: 99 cells/uL (ref 15–500)
Eosinophils Relative: 1.5 %
HCT: 44.6 % (ref 35.0–45.0)
Hemoglobin: 14.8 g/dL (ref 11.7–15.5)
Lymphs Abs: 2501 cells/uL (ref 850–3900)
MCH: 29.2 pg (ref 27.0–33.0)
MCHC: 33.2 g/dL (ref 32.0–36.0)
MCV: 88 fL (ref 80.0–100.0)
MPV: 9.7 fL (ref 7.5–12.5)
Monocytes Relative: 7.3 %
Neutro Abs: 3478 cells/uL (ref 1500–7800)
Neutrophils Relative %: 52.7 %
Platelets: 296 10*3/uL (ref 140–400)
RBC: 5.07 10*6/uL (ref 3.80–5.10)
RDW: 13.1 % (ref 11.0–15.0)
Total Lymphocyte: 37.9 %
WBC: 6.6 10*3/uL (ref 3.8–10.8)

## 2023-02-15 LAB — SEDIMENTATION RATE: Sed Rate: 6 mm/h (ref 0–30)

## 2023-02-15 NOTE — Progress Notes (Signed)
Sed rate normal. CBC normal.

## 2023-02-17 ENCOUNTER — Other Ambulatory Visit: Payer: Self-pay | Admitting: Physician Assistant

## 2023-02-17 DIAGNOSIS — E1169 Type 2 diabetes mellitus with other specified complication: Secondary | ICD-10-CM

## 2023-03-02 ENCOUNTER — Other Ambulatory Visit: Payer: Self-pay | Admitting: Physician Assistant

## 2023-03-02 DIAGNOSIS — E1169 Type 2 diabetes mellitus with other specified complication: Secondary | ICD-10-CM

## 2023-03-02 MED ORDER — METFORMIN HCL ER 750 MG PO TB24: ORAL_TABLET | ORAL | 1 refills | Status: DC

## 2023-04-20 LAB — HM DIABETES EYE EXAM

## 2023-05-18 ENCOUNTER — Other Ambulatory Visit: Payer: Self-pay | Admitting: Physician Assistant

## 2023-05-18 ENCOUNTER — Ambulatory Visit: Payer: BC Managed Care – PPO | Admitting: Physician Assistant

## 2023-05-18 ENCOUNTER — Encounter: Payer: Self-pay | Admitting: Physician Assistant

## 2023-05-18 VITALS — BP 124/74 | HR 80 | Ht 62.0 in | Wt 161.4 lb

## 2023-05-18 DIAGNOSIS — E785 Hyperlipidemia, unspecified: Secondary | ICD-10-CM

## 2023-05-18 DIAGNOSIS — G720 Drug-induced myopathy: Secondary | ICD-10-CM | POA: Diagnosis not present

## 2023-05-18 DIAGNOSIS — Z794 Long term (current) use of insulin: Secondary | ICD-10-CM | POA: Diagnosis not present

## 2023-05-18 DIAGNOSIS — E1169 Type 2 diabetes mellitus with other specified complication: Secondary | ICD-10-CM | POA: Diagnosis not present

## 2023-05-18 DIAGNOSIS — E559 Vitamin D deficiency, unspecified: Secondary | ICD-10-CM | POA: Diagnosis not present

## 2023-05-18 DIAGNOSIS — E66812 Obesity, class 2: Secondary | ICD-10-CM | POA: Insufficient documentation

## 2023-05-18 DIAGNOSIS — R748 Abnormal levels of other serum enzymes: Secondary | ICD-10-CM

## 2023-05-18 DIAGNOSIS — E663 Overweight: Secondary | ICD-10-CM

## 2023-05-18 DIAGNOSIS — K219 Gastro-esophageal reflux disease without esophagitis: Secondary | ICD-10-CM

## 2023-05-18 DIAGNOSIS — T466X5A Adverse effect of antihyperlipidemic and antiarteriosclerotic drugs, initial encounter: Secondary | ICD-10-CM

## 2023-05-18 LAB — POCT GLYCOSYLATED HEMOGLOBIN (HGB A1C): Hemoglobin A1C: 5.3 % (ref 4.0–5.6)

## 2023-05-18 NOTE — Progress Notes (Signed)
Established Patient Office Visit  Subjective   Patient ID: Tina Mcgrath, female    DOB: 09-Feb-1956  Age: 67 y.o. MRN: [id number]  Chief Complaint  Patient presents with   Medical Management of Chronic Issues    Last A1c was 5.5    HPI Pt is a 67 yo female with T2DM, HTN, HLD who presents to the clinic for follow up.   She is doing well. She is active. She is checking her sugars and running 80-100 fasting. No open sores or wounds. She has good energy. She is compliant with medications.   .. Active Ambulatory Problems    Diagnosis Date Noted   Hyperlipidemia 08/19/2014   Elevated vitamin B12 level 08/19/2014   Hematuria 08/19/2014   Primary osteoarthritis of left knee 08/19/2014   Dry eyes 08/19/2014   Migraine without aura and with status migrainosus, not intractable 08/19/2014   Mild sleep apnea 08/19/2014   Fecal incontinence 08/19/2014   Elevated triglycerides with high cholesterol 09/03/2014   Diabetes mellitus (HCC) 09/03/2014   Proteinuria 09/10/2014   Chronic cystitis 01/06/2015   Vitamin D deficiency 08/27/2015   History of right breast cancer 11/04/2015   Cough 03/02/2016   Carpal tunnel syndrome on both sides 08/23/2016   Ulnar neuropathy of both upper extremities 08/23/2016   Glaucoma suspect of both eyes 08/25/2016   Elevated liver enzymes 11/28/2016   Onychomycosis 11/28/2016   Fatty liver disease, nonalcoholic 01/19/2017   Hyperlipidemia associated with type 2 diabetes mellitus (HCC) 12/20/2017   Osteoarthritis of right hip 03/23/2018   Trouble in sleeping 06/28/2018   Grief reaction 06/28/2018   Hip flexor tendinitis, right 06/28/2018   Statin intolerance 06/29/2018   Gastroesophageal reflux disease 11/14/2019   Family history of esophageal cancer 12/12/2019   Status post cataract extraction 02/12/2020   Status post right hip replacement 02/12/2020   Leg cramps 11/19/2020   Skin mass 05/20/2021   Overweight (BMI 25.0-29.9) 05/20/2021   Orthostatic  hypotension 02/10/2022   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/31/2022   Prominent vein 02/15/2023   Resolved Ambulatory Problems    Diagnosis Date Noted   Impaired fasting glucose 08/19/2014   Breast cancer (HCC) 11/13/2014   Elevated blood pressure 11/13/2014   Post-viral cough syndrome 11/04/2015   Prediabetes 11/21/2015   Class 1 obesity due to excess calories with serious comorbidity and body mass index (BMI) of 31.0 to 31.9 in adult 03/02/2016   Right groin pain 03/23/2018   Class 2 severe obesity due to excess calories with serious comorbidity and body mass index (BMI) of 39.0 to 39.9 in adult [facility]) 05/18/2023   Past Medical History:  Diagnosis Date   Cancer (HCC)    History of cancer of right breast 11/04/2015   HSV-2 (herpes simplex virus 2) infection    Migraines    Obesity 03/02/2016   Osteoarthritis    Rosacea    Sleep apnea      Review of Systems  All other systems reviewed and are negative.     Objective:     BP 124/74   Pulse 80   Ht 5\' 2"  (1.575 m)   Wt 161 lb 6.4 oz (73.2 kg)   SpO2 99%   BMI 29.52 kg/m  BP Readings from Last 3 Encounters:  05/18/23 124/74  02/14/23 131/70  11/15/22 138/88   Wt Readings from Last 3 Encounters:  05/18/23 161 lb 6.4 oz (73.2 kg)  02/14/23 170 lb 4 oz (77.2 kg)  11/15/22 164 lb (74.4 kg)    .[name]  02/14/2023    3:41 PM 11/15/2022    9:18 AM 05/14/2022    9:20 AM 02/10/2022    9:13 AM 11/10/2021    9:21 AM  Depression screen PHQ 2/9  Decreased Interest 0 0 0 0 0  Down, Depressed, Hopeless 0 0 0 0 0  PHQ - 2 Score 0 0 0 0 0     Physical Exam Constitutional:      Appearance: Normal appearance.  HENT:     Head: Normocephalic.  Cardiovascular:     Rate and Rhythm: Normal rate and regular rhythm.     Heart sounds: Normal heart sounds.  Pulmonary:     Effort: Pulmonary effort is normal.     Breath sounds: Normal breath sounds.  Musculoskeletal:     Right lower leg: No edema.     Left lower leg: No edema.  Neurological:      General: No focal deficit present.     Mental Status: She is alert and oriented to person, place, and time.  Psychiatric:        Mood and Affect: Mood normal.      Results for orders placed or performed in visit on 05/18/23  POCT HgB A1C  Result Value Ref Range   Hemoglobin A1C 5.3 4.0 - 5.6 %   HbA1c POC (<> result, manual entry)     HbA1c, POC (prediabetic range)     HbA1c, POC (controlled diabetic range)        The 10-year ASCVD risk score (Arnett DK, et al., 2019) is: 6.8%    Assessment & Plan:  [name] KitchenMarland KitchenAlayasia was seen today for medical management of chronic issues.  Diagnoses and all orders for this visit:  Type 2 diabetes mellitus with other specified complication, with long-term current use of insulin (HCC) -     POCT HgB A1C -     Semaglutide, 1 MG/DOSE, (OZEMPIC, 1 MG/DOSE,) 4 MG/3ML SOPN; Inject 1 mg into the skin once a week. INJECT 1 MG INTO THE SKIN EVERY WEEK  Overweight (BMI 25.0-29.9)  Statin myopathy  Hyperlipidemia associated with type 2 diabetes mellitus (HCC)  Vitamin D deficiency  Elevated vitamin B12 level  Gastroesophageal reflux disease without esophagitis   A1C to goal today.  Continue on ozempic Stop metformin  Recheck A1C in 3-6 months BP to goal Does not tolerate statin and repatha increases blood sugars and does not want to be on that She is taking zetia She has had eye exam. Will get [place] copy. UTD foot exam.  Declines all vaccines.     Return in about 6 months (around 11/15/2023).    Tandy Gaw, PA-C

## 2023-05-19 ENCOUNTER — Encounter: Payer: Self-pay | Admitting: Physician Assistant

## 2023-05-19 DIAGNOSIS — Z1231 Encounter for screening mammogram for malignant neoplasm of breast: Secondary | ICD-10-CM

## 2023-05-20 MED ORDER — OZEMPIC (1 MG/DOSE) 4 MG/3ML ~~LOC~~ SOPN: 1.0000 mg | PEN_INJECTOR | SUBCUTANEOUS | 1 refills | Status: DC

## 2023-05-26 ENCOUNTER — Encounter: Payer: Self-pay | Admitting: Physician Assistant

## 2023-05-27 ENCOUNTER — Other Ambulatory Visit: Payer: Self-pay | Admitting: Physician Assistant

## 2023-05-27 DIAGNOSIS — G5603 Carpal tunnel syndrome, bilateral upper limbs: Secondary | ICD-10-CM

## 2023-06-10 ENCOUNTER — Encounter: Payer: Self-pay | Admitting: Physician Assistant

## 2023-06-10 DIAGNOSIS — J302 Other seasonal allergic rhinitis: Secondary | ICD-10-CM

## 2023-06-10 MED ORDER — IPRATROPIUM BROMIDE 0.06 % NA SOLN: 2.0000 | Freq: Four times a day (QID) | NASAL | 1 refills | Status: DC

## 2023-06-10 MED ORDER — ALBUTEROL SULFATE HFA 108 (90 BASE) MCG/ACT IN AERS: 2.0000 | INHALATION_SPRAY | Freq: Four times a day (QID) | RESPIRATORY_TRACT | 0 refills | Status: DC | PRN

## 2023-06-21 MED ORDER — METHYLPREDNISOLONE 4 MG PO TBPK: ORAL_TABLET | ORAL | 0 refills | Status: DC

## 2023-06-21 NOTE — Addendum Note (Signed)
Addended by: Jomarie Longs on: 06/21/2023 01:04 PM   Modules accepted: Orders

## 2023-06-28 NOTE — Telephone Encounter (Signed)
Please schedule

## 2023-06-29 NOTE — Telephone Encounter (Signed)
Patient schld for 07/01/23  with Tandy Gaw.

## 2023-07-01 ENCOUNTER — Encounter: Payer: Self-pay | Admitting: Physician Assistant

## 2023-07-01 ENCOUNTER — Ambulatory Visit (INDEPENDENT_AMBULATORY_CARE_PROVIDER_SITE_OTHER): Payer: BC Managed Care – PPO | Admitting: Physician Assistant

## 2023-07-01 VITALS — BP 120/69 | HR 99 | Ht 62.0 in | Wt 156.2 lb

## 2023-07-01 DIAGNOSIS — J302 Other seasonal allergic rhinitis: Secondary | ICD-10-CM

## 2023-07-01 DIAGNOSIS — R052 Subacute cough: Secondary | ICD-10-CM | POA: Diagnosis not present

## 2023-07-01 MED ORDER — IPRATROPIUM BROMIDE 0.06 % NA SOLN: 2.0000 | Freq: Four times a day (QID) | NASAL | 1 refills | Status: DC

## 2023-07-01 MED ORDER — PANTOPRAZOLE SODIUM 40 MG PO TBEC
40.0000 mg | DELAYED_RELEASE_TABLET | Freq: Two times a day (BID) | ORAL | 3 refills | Status: DC
Start: 2023-07-01 — End: 2024-05-18

## 2023-07-01 MED ORDER — PANTOPRAZOLE SODIUM 40 MG PO TBEC: 40.0000 mg | DELAYED_RELEASE_TABLET | Freq: Every day | ORAL | 11 refills | Status: DC

## 2023-07-01 NOTE — Progress Notes (Unsigned)
Acute Office Visit  Subjective:     Patient ID: Tina Mcgrath, female    DOB: 01-May-1956, 67 y.o.   MRN: [id number]  Chief Complaint  Patient presents with   Cough    Lingering cough x  2 months.     HPI Patient is in today for dry cough for last 2 months. She has hx of this cough. She denies any fever, chills, body aches, sinus pressure, fever. She does have a raspy voice but she always has that. She went to ENT once before over persistent cough and given protonix. Pt was not able to get protonix and has been out. No signs of reflux. Denies any blood in stool or changes in bowel movements. She does have seasonal allergies and chronic rhinitis. She uses nasal sprays, atrovent and anti-histamines which are beneficial. Medrol dose pack helped by 50 percent.   .. Active Ambulatory Problems    Diagnosis Date Noted   Hyperlipidemia 08/19/2014   Elevated vitamin B12 level 08/19/2014   Hematuria 08/19/2014   Primary osteoarthritis of left knee 08/19/2014   Dry eyes 08/19/2014   Migraine without aura and with status migrainosus, not intractable 08/19/2014   Mild sleep apnea 08/19/2014   Fecal incontinence 08/19/2014   Elevated triglycerides with high cholesterol 09/03/2014   Diabetes mellitus (HCC) 09/03/2014   Proteinuria 09/10/2014   Chronic cystitis 01/06/2015   Vitamin D deficiency 08/27/2015   History of right breast cancer 11/04/2015   Cough 03/02/2016   Carpal tunnel syndrome on both sides 08/23/2016   Ulnar neuropathy of both upper extremities 08/23/2016   Glaucoma suspect of both eyes 08/25/2016   Elevated liver enzymes 11/28/2016   Onychomycosis 11/28/2016   Fatty liver disease, nonalcoholic 01/19/2017   Hyperlipidemia associated with type 2 diabetes mellitus (HCC) 12/20/2017   Osteoarthritis of right hip 03/23/2018   Trouble in sleeping 06/28/2018   Grief reaction 06/28/2018   Hip flexor tendinitis, right 06/28/2018   Statin intolerance 06/29/2018   Gastroesophageal  reflux disease 11/14/2019   Family history of esophageal cancer 12/12/2019   Status post cataract extraction 02/12/2020   Status post right hip replacement 02/12/2020   Leg cramps 11/19/2020   Skin mass 05/20/2021   Overweight (BMI 25.0-29.9) 05/20/2021   Orthostatic hypotension 02/10/2022   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/31/2022   Prominent vein 02/15/2023   Resolved Ambulatory Problems    Diagnosis Date Noted   Impaired fasting glucose 08/19/2014   Breast cancer (HCC) 11/13/2014   Elevated blood pressure 11/13/2014   Post-viral cough syndrome 11/04/2015   Prediabetes 11/21/2015   Class 1 obesity due to excess calories with serious comorbidity and body mass index (BMI) of 31.0 to 31.9 in adult 03/02/2016   Right groin pain 03/23/2018   Class 2 severe obesity due to excess calories with serious comorbidity and body mass index (BMI) of 39.0 to 39.9 in adult [facility]) 05/18/2023   Past Medical History:  Diagnosis Date   Cancer (HCC)    History of cancer of right breast 11/04/2015   HSV-2 (herpes simplex virus 2) infection    Migraines    Obesity 03/02/2016   Osteoarthritis    Rosacea    Sleep apnea   ]  ROS  See HPI.     Objective:    BP 120/69   Pulse 99   Ht 5\' 2"  (1.575 m)   Wt 156 lb 4 oz (70.9 kg)   SpO2 100%   BMI 28.58 kg/m  BP Readings from Last 3 Encounters:  07/01/23 120/69  05/18/23 124/74  02/14/23 131/70   Wt Readings from Last 3 Encounters:  07/01/23 156 lb 4 oz (70.9 kg)  05/18/23 161 lb 6.4 oz (73.2 kg)  02/14/23 170 lb 4 oz (77.2 kg)      Physical Exam Constitutional:      Appearance: Normal appearance.  HENT:     Head: Normocephalic.     Right Ear: Tympanic membrane, ear canal and external ear normal. There is no impacted cerumen.     Left Ear: Tympanic membrane, ear canal and external ear normal. There is no impacted cerumen.     Nose: Nose normal.     Mouth/Throat:     Mouth: Mucous membranes are moist.     Comments: Raspy Voice Eyes:      Conjunctiva/sclera: Conjunctivae normal.  Cardiovascular:     Rate and Rhythm: Normal rate and regular rhythm.  Pulmonary:     Effort: Pulmonary effort is normal.     Breath sounds: Normal breath sounds.  Musculoskeletal:     Cervical back: Normal range of motion and neck supple. No tenderness.     Right lower leg: No edema.     Left lower leg: No edema.  Lymphadenopathy:     Cervical: No cervical adenopathy.  Neurological:     General: No focal deficit present.     Mental Status: She is alert and oriented to person, place, and time.  Psychiatric:        Mood and Affect: Mood normal.           Assessment & Plan:  [name] KitchenMarland KitchenJaliana was seen today for cough.  Diagnoses and all orders for this visit:  Subacute cough -     pantoprazole (PROTONIX) 40 MG tablet; Take 1 tablet (40 mg total) by mouth 2 (two) times daily. -     ipratropium (ATROVENT) 0.06 % nasal spray; Place 2 sprays into both nostrils 4 (four) times daily.  Seasonal allergies -     ipratropium (ATROVENT) 0.06 % nasal spray; Place 2 sprays into both nostrils 4 (four) times daily.  Other orders -     Discontinue: pantoprazole (PROTONIX) 40 MG tablet; Take 1 tablet (40 mg total) by mouth daily.   No concerns of infection. Add back protonix. Continue anti-histamine and atrovent nasal spray. Follow up as needed or if needs referral.    Tandy Gaw, PA-C

## 2023-09-12 ENCOUNTER — Encounter: Payer: Self-pay | Admitting: Physician Assistant

## 2023-11-15 ENCOUNTER — Ambulatory Visit (INDEPENDENT_AMBULATORY_CARE_PROVIDER_SITE_OTHER): Payer: BC Managed Care – PPO | Admitting: Physician Assistant

## 2023-11-15 ENCOUNTER — Encounter: Payer: Self-pay | Admitting: Physician Assistant

## 2023-11-15 VITALS — BP 134/83 | HR 95 | Ht 62.0 in | Wt 162.0 lb

## 2023-11-15 DIAGNOSIS — E1169 Type 2 diabetes mellitus with other specified complication: Secondary | ICD-10-CM

## 2023-11-15 DIAGNOSIS — Z794 Long term (current) use of insulin: Secondary | ICD-10-CM | POA: Diagnosis not present

## 2023-11-15 DIAGNOSIS — R809 Proteinuria, unspecified: Secondary | ICD-10-CM

## 2023-11-15 DIAGNOSIS — J302 Other seasonal allergic rhinitis: Secondary | ICD-10-CM | POA: Diagnosis not present

## 2023-11-15 DIAGNOSIS — E782 Mixed hyperlipidemia: Secondary | ICD-10-CM

## 2023-11-15 DIAGNOSIS — T466X5A Adverse effect of antihyperlipidemic and antiarteriosclerotic drugs, initial encounter: Secondary | ICD-10-CM

## 2023-11-15 DIAGNOSIS — G5603 Carpal tunnel syndrome, bilateral upper limbs: Secondary | ICD-10-CM

## 2023-11-15 DIAGNOSIS — G720 Drug-induced myopathy: Secondary | ICD-10-CM | POA: Diagnosis not present

## 2023-11-15 DIAGNOSIS — R052 Subacute cough: Secondary | ICD-10-CM

## 2023-11-15 DIAGNOSIS — E559 Vitamin D deficiency, unspecified: Secondary | ICD-10-CM

## 2023-11-15 DIAGNOSIS — Z1329 Encounter for screening for other suspected endocrine disorder: Secondary | ICD-10-CM

## 2023-11-15 LAB — POCT UA - MICROALBUMIN
Creatinine, POC: 10 mg/dL
Microalbumin Ur, POC: 10 mg/L

## 2023-11-15 MED ORDER — GABAPENTIN 100 MG PO CAPS: ORAL_CAPSULE | ORAL | 1 refills | Status: DC

## 2023-11-15 MED ORDER — OZEMPIC (1 MG/DOSE) 4 MG/3ML ~~LOC~~ SOPN: 1.0000 mg | PEN_INJECTOR | SUBCUTANEOUS | 1 refills | Status: DC

## 2023-11-15 MED ORDER — EZETIMIBE 10 MG PO TABS
10.0000 mg | ORAL_TABLET | Freq: Every day | ORAL | 3 refills | Status: AC
Start: 2023-11-15 — End: ?

## 2023-11-15 MED ORDER — IPRATROPIUM BROMIDE 0.06 % NA SOLN: 2.0000 | Freq: Four times a day (QID) | NASAL | 1 refills | Status: DC

## 2023-11-15 NOTE — Progress Notes (Signed)
 Established Patient Office Visit  Subjective   Patient ID: Tina Mcgrath, female    DOB: 07/02/1956  Age: 68 y.o. MRN: [id number]  CC: 6 month T2DM follow up   HPI Pt is a 68 yo female with T2DM, HTN, HLD who presents to the clinic for 6 month follow up.    She is doing well. She is active and walking 1.5 miles/day. She is checking her sugars and running 100-102 fasting. She is compliant with all medications. She denies any chest pain, dizziness, shortness of breath, paresthesias, or headaches.   She states that work has been extra stressful for her due to extra workload.   She is using Pantoprazole once daily.   Active Ambulatory Problems    Diagnosis Date Noted   Hyperlipidemia 08/19/2014   Elevated vitamin B12 level 08/19/2014   Hematuria 08/19/2014   Primary osteoarthritis of left knee 08/19/2014   Dry eyes 08/19/2014   Migraine without aura and with status migrainosus, not intractable 08/19/2014   Mild sleep apnea 08/19/2014   Fecal incontinence 08/19/2014   Elevated triglycerides with high cholesterol 09/03/2014   Diabetes mellitus (HCC) 09/03/2014   Proteinuria 09/10/2014   Chronic cystitis 01/06/2015   Vitamin D deficiency 08/27/2015   History of right breast cancer 11/04/2015   Cough 03/02/2016   Carpal tunnel syndrome on both sides 08/23/2016   Ulnar neuropathy of both upper extremities 08/23/2016   Glaucoma suspect of both eyes 08/25/2016   Elevated liver enzymes 11/28/2016   Onychomycosis 11/28/2016   Fatty liver disease, nonalcoholic 01/19/2017   Hyperlipidemia associated with type 2 diabetes mellitus (HCC) 12/20/2017   Osteoarthritis of right hip 03/23/2018   Trouble in sleeping 06/28/2018   Grief reaction 06/28/2018   Hip flexor tendinitis, right 06/28/2018   Statin intolerance 06/29/2018   Gastroesophageal reflux disease 11/14/2019   Family history of esophageal cancer 12/12/2019   Status post cataract extraction 02/12/2020   Status post right hip  replacement 02/12/2020   Leg cramps 11/19/2020   Skin mass 05/20/2021   Overweight (BMI 25.0-29.9) 05/20/2021   Orthostatic hypotension 02/10/2022   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/31/2022   Prominent vein 02/15/2023   Microalbuminuria 11/15/2023   Resolved Ambulatory Problems    Diagnosis Date Noted   Impaired fasting glucose 08/19/2014   Breast cancer (HCC) 11/13/2014   Elevated blood pressure 11/13/2014   Post-viral cough syndrome 11/04/2015   Prediabetes 11/21/2015   Class 1 obesity due to excess calories with serious comorbidity and body mass index (BMI) of 31.0 to 31.9 in adult 03/02/2016   Right groin pain 03/23/2018   Class 2 severe obesity due to excess calories with serious comorbidity and body mass index (BMI) of 39.0 to 39.9 in adult [facility]) 05/18/2023   Past Medical History:  Diagnosis Date   Cancer (HCC)    History of cancer of right breast 11/04/2015   HSV-2 (herpes simplex virus 2) infection    Migraines    Obesity 03/02/2016   Osteoarthritis    Rosacea    Sleep apnea     ROS See HPI    Objective:    BP 134/83 (BP Location: Left Arm, Patient Position: Sitting, Cuff Size: Normal)   Pulse 95   Ht 5\' 2"  (1.575 m)   Wt 162 lb (73.5 kg)   SpO2 96%   BMI 29.63 kg/m  BP Readings from Last 3 Encounters:  11/15/23 134/83  07/01/23 120/69  05/18/23 124/74   Wt Readings from Last 3 Encounters:  11/15/23 162 lb (73.5  kg)  07/01/23 156 lb 4 oz (70.9 kg)  05/18/23 161 lb 6.4 oz (73.2 kg)   SpO2 Readings from Last 3 Encounters:  11/15/23 96%  07/01/23 100%  05/18/23 99%   Physical Exam Constitutional:      Appearance: Normal appearance.  HENT:     Head: Normocephalic and atraumatic.  Cardiovascular:     Rate and Rhythm: Normal rate and regular rhythm.     Pulses: Normal pulses.     Heart sounds: Normal heart sounds.  Pulmonary:     Effort: Pulmonary effort is normal.     Breath sounds: Normal breath sounds.  Skin:    General: Skin is warm.     Capillary  Refill: Capillary refill takes less than 2 seconds.  Neurological:     Mental Status: She is alert.  Psychiatric:        Mood and Affect: Mood normal.    Last CBC Lab Results  Component Value Date   WBC 7.4 11/15/2023   HGB 14.8 11/15/2023   HCT 45.6 11/15/2023   MCV 89 11/15/2023   MCH 29.0 11/15/2023   RDW 12.7 11/15/2023   PLT 266 11/15/2023   Last metabolic panel Lab Results  Component Value Date   GLUCOSE 105 (H) 11/15/2023   NA 138 11/15/2023   K 6.0 (H) 11/15/2023   CL 98 11/15/2023   CO2 18 (L) 11/15/2023   BUN 13 11/15/2023   CREATININE 0.74 11/15/2023   EGFR 90 11/15/2023   CALCIUM 10.0 11/15/2023   PROT 6.9 11/15/2023   ALBUMIN 4.5 11/15/2023   LABGLOB 2.4 11/15/2023   BILITOT 0.5 11/15/2023   ALKPHOS 82 11/15/2023   AST 27 11/15/2023   ALT 13 11/15/2023   ANIONGAP 12 01/14/2017   Last lipids Lab Results  Component Value Date   CHOL 202 (H) 11/15/2023   HDL 75 11/15/2023   LDLCALC 108 (H) 11/15/2023   TRIG 110 11/15/2023   CHOLHDL 2.7 11/15/2023   Last hemoglobin A1c Lab Results  Component Value Date   HGBA1C 5.7 (H) 11/15/2023   Last thyroid functions Lab Results  Component Value Date   TSH 2.200 11/15/2023    The 10-year ASCVD risk score (Arnett DK, et al., 2019) is: 10.1%    Assessment & Plan:  [name] KitchenMarland KitchenAerielle was seen today for medical management of chronic issues.  Diagnoses and all orders for this visit:  Type 2 diabetes mellitus with other specified complication, with long-term current use of insulin (HCC) -     CMP14+EGFR -     Hemoglobin A1c -     CBC w/Diff/Platelet -     Semaglutide, 1 MG/DOSE, (OZEMPIC, 1 MG/DOSE,) 4 MG/3ML SOPN; Inject 1 mg into the skin once a week. INJECT 1 MG INTO THE SKIN EVERY WEEK -     POCT UA - Microalbumin  Statin myopathy -     CMP14+EGFR  Carpal tunnel syndrome on both sides -     CMP14+EGFR -     gabapentin (NEURONTIN) 100 MG capsule; TAKE 1 CAPSULE(100 MG) BY MOUTH THREE TIMES  DAILY  Seasonal allergies -     CMP14+EGFR -     ipratropium (ATROVENT) 0.06 % nasal spray; Place 2 sprays into both nostrils 4 (four) times daily.  Subacute cough -     ipratropium (ATROVENT) 0.06 % nasal spray; Place 2 sprays into both nostrils 4 (four) times daily.  Mixed hyperlipidemia -     Lipid panel -     CMP14+EGFR  Vitamin  D deficiency -     VITAMIN D 25 Hydroxy (Vit-D Deficiency, Fractures)  Thyroid disorder screening -     TSH  Microalbuminuria  Other orders -     ezetimibe (ZETIA) 10 MG tablet; Take 1 tablet (10 mg total) by mouth daily.    - DM Foot exam performed today -A1C to be checked in labs -continue same medications - DM kidney Cr: albumin ratio today   - Patient educated on ACE/ARB/SGLT2i medications for kidney protection   - Will continue to follow her kidney function - She has her PAP Smear scheduled for May  - HgB A1C today with fasting labs - Fasting labs ordered today  Follow up in 6 months   Tandy Gaw, PA-C

## 2023-11-16 ENCOUNTER — Encounter: Payer: Self-pay | Admitting: Physician Assistant

## 2023-11-16 DIAGNOSIS — R7989 Other specified abnormal findings of blood chemistry: Secondary | ICD-10-CM

## 2023-11-16 DIAGNOSIS — E875 Hyperkalemia: Secondary | ICD-10-CM

## 2023-11-16 LAB — HEMOGLOBIN A1C
Est. average glucose Bld gHb Est-mCnc: 117 mg/dL
Hgb A1c MFr Bld: 5.7 % — ABNORMAL HIGH (ref 4.8–5.6)

## 2023-11-16 LAB — CMP14+EGFR
ALT: 13 IU/L (ref 0–32)
AST: 27 IU/L (ref 0–40)
Albumin: 4.5 g/dL (ref 3.9–4.9)
Alkaline Phosphatase: 82 IU/L (ref 44–121)
BUN/Creatinine Ratio: 18 (ref 12–28)
BUN: 13 mg/dL (ref 8–27)
Bilirubin Total: 0.5 mg/dL (ref 0.0–1.2)
CO2: 18 mmol/L — ABNORMAL LOW (ref 20–29)
Calcium: 10 mg/dL (ref 8.7–10.3)
Chloride: 98 mmol/L (ref 96–106)
Creatinine, Ser: 0.74 mg/dL (ref 0.57–1.00)
Globulin, Total: 2.4 g/dL (ref 1.5–4.5)
Glucose: 105 mg/dL — ABNORMAL HIGH (ref 70–99)
Potassium: 6 mmol/L — ABNORMAL HIGH (ref 3.5–5.2)
Sodium: 138 mmol/L (ref 134–144)
Total Protein: 6.9 g/dL (ref 6.0–8.5)
eGFR: 90 mL/min/{1.73_m2} (ref >59)

## 2023-11-16 LAB — LIPID PANEL
Chol/HDL Ratio: 2.7 ratio (ref 0.0–4.4)
Cholesterol, Total: 202 mg/dL — ABNORMAL HIGH (ref 100–199)
HDL: 75 mg/dL (ref >39)
LDL Chol Calc (NIH): 108 mg/dL — ABNORMAL HIGH (ref 0–99)
Triglycerides: 110 mg/dL (ref 0–149)
VLDL Cholesterol Cal: 19 mg/dL (ref 5–40)

## 2023-11-16 LAB — CBC WITH DIFFERENTIAL/PLATELET
Basophils Absolute: 0 10*3/uL (ref 0.0–0.2)
Basos: 1 %
EOS (ABSOLUTE): 0.1 10*3/uL (ref 0.0–0.4)
Eos: 1 %
Hematocrit: 45.6 % (ref 34.0–46.6)
Hemoglobin: 14.8 g/dL (ref 11.1–15.9)
Immature Grans (Abs): 0 10*3/uL (ref 0.0–0.1)
Immature Granulocytes: 0 %
Lymphocytes Absolute: 1.7 10*3/uL (ref 0.7–3.1)
Lymphs: 22 %
MCH: 29 pg (ref 26.6–33.0)
MCHC: 32.5 g/dL (ref 31.5–35.7)
MCV: 89 fL (ref 79–97)
Monocytes Absolute: 0.4 10*3/uL (ref 0.1–0.9)
Monocytes: 6 %
Neutrophils Absolute: 5.1 10*3/uL (ref 1.4–7.0)
Neutrophils: 70 %
Platelets: 266 10*3/uL (ref 150–450)
RBC: 5.11 x10E6/uL (ref 3.77–5.28)
RDW: 12.7 % (ref 11.7–15.4)
WBC: 7.4 10*3/uL (ref 3.4–10.8)

## 2023-11-16 LAB — TSH: TSH: 2.2 u[IU]/mL (ref 0.450–4.500)

## 2023-11-16 LAB — VITAMIN D 25 HYDROXY (VIT D DEFICIENCY, FRACTURES): Vit D, 25-Hydroxy: 76.4 ng/mL (ref 30.0–100.0)

## 2023-11-16 NOTE — Progress Notes (Signed)
 Tina Mcgrath,   Thyroid looks GREAT.  Vitamin D normal.  A1C 5.7 and wonderful! Kidney and liver normal.   Your LdL not to goal of under 70. You are on the zetia. I know the repatha was expensive but would help to get you to goal. Thoughts?   Your potassium is elevated. Need to recheck  when you get back from beach. Are you taking extra potassium or supplements with potassium in them?

## 2023-11-26 LAB — B12 AND FOLATE PANEL
Folate: 17 ng/mL (ref >3.0)
Vitamin B-12: 762 pg/mL (ref 232–1245)

## 2023-11-26 LAB — POTASSIUM: Potassium: 5.2 mmol/L (ref 3.5–5.2)

## 2023-11-28 ENCOUNTER — Encounter: Payer: Self-pay | Admitting: Physician Assistant

## 2023-11-28 NOTE — Progress Notes (Signed)
 B12 looks GREAT. Folate looks good.  Potassium back into normal range.  Great news.

## 2024-02-01 ENCOUNTER — Encounter: Payer: Self-pay | Admitting: Physician Assistant

## 2024-02-01 NOTE — Telephone Encounter (Signed)
 Needs appt, will likely need to do some imaging at this point

## 2024-02-06 ENCOUNTER — Ambulatory Visit: Admitting: Physician Assistant

## 2024-02-10 ENCOUNTER — Ambulatory Visit: Admitting: Physician Assistant

## 2024-04-30 ENCOUNTER — Ambulatory Visit (INDEPENDENT_AMBULATORY_CARE_PROVIDER_SITE_OTHER): Admitting: Physician Assistant

## 2024-04-30 ENCOUNTER — Encounter: Payer: Self-pay | Admitting: Physician Assistant

## 2024-04-30 VITALS — BP 134/85

## 2024-04-30 DIAGNOSIS — R21 Rash and other nonspecific skin eruption: Secondary | ICD-10-CM | POA: Diagnosis not present

## 2024-04-30 MED ORDER — TRIAMCINOLONE ACETONIDE 0.1 % EX CREA: 1.0000 | TOPICAL_CREAM | Freq: Two times a day (BID) | CUTANEOUS | 1 refills | Status: AC

## 2024-04-30 NOTE — Patient Instructions (Signed)

## 2024-04-30 NOTE — Progress Notes (Signed)
   New Patient Visit   Subjective  Tina Mcgrath is a 68 y.o. female NEW PATIENT who presents for the following: Spots of groin area, right inframammary and right upper arm for about a year. They get itchy. History of lichen planus of oral mucosa. History of breast cancer (right breast) 2008. Family history of NMSC in father and brother.    The following portions of the chart were reviewed this encounter and updated as appropriate: medications, allergies, medical history  Review of Systems:  No other skin or systemic complaints except as noted in HPI or Assessment and Plan.  Objective  Well appearing patient in no apparent distress; mood and affect are within normal limits.  A focused examination was performed of the following areas: Face, chest, abdomen and upper thighs.   Relevant exam findings are noted in the Assessment and Plan.    Assessment & Plan   RASH -- LICHEN PLANUS (LIKELY) Exam: pink and brown patches    Treatment Plan: TMC 0.1% cream Apply to affected areas twice daily until improved  We will plan to a biopsy on follow if not improving.  RASH AND OTHER NONSPECIFIC SKIN ERUPTION    Return in about 8 weeks (around 06/25/2024) for  Follow up, TBSE.  I, Roseline Hutchinson, CMA, am acting as scribe for Vartan Kerins K, PA-C .   Documentation: I have reviewed the above documentation for accuracy and completeness, and I agree with the above.  Tremont Gavitt K, PA-C

## 2024-05-11 ENCOUNTER — Other Ambulatory Visit: Payer: Self-pay | Admitting: Physician Assistant

## 2024-05-11 DIAGNOSIS — G5603 Carpal tunnel syndrome, bilateral upper limbs: Secondary | ICD-10-CM

## 2024-05-18 ENCOUNTER — Ambulatory Visit: Admitting: Physician Assistant

## 2024-05-18 ENCOUNTER — Encounter: Payer: Self-pay | Admitting: Physician Assistant

## 2024-05-18 VITALS — BP 118/75 | HR 102 | Ht 62.0 in | Wt 162.0 lb

## 2024-05-18 DIAGNOSIS — R053 Chronic cough: Secondary | ICD-10-CM | POA: Diagnosis not present

## 2024-05-18 DIAGNOSIS — E782 Mixed hyperlipidemia: Secondary | ICD-10-CM | POA: Diagnosis not present

## 2024-05-18 DIAGNOSIS — E1129 Type 2 diabetes mellitus with other diabetic kidney complication: Secondary | ICD-10-CM

## 2024-05-18 DIAGNOSIS — J302 Other seasonal allergic rhinitis: Secondary | ICD-10-CM | POA: Diagnosis not present

## 2024-05-18 DIAGNOSIS — E1169 Type 2 diabetes mellitus with other specified complication: Secondary | ICD-10-CM

## 2024-05-18 DIAGNOSIS — L661 Lichen planopilaris, unspecified: Secondary | ICD-10-CM

## 2024-05-18 DIAGNOSIS — R809 Proteinuria, unspecified: Secondary | ICD-10-CM | POA: Diagnosis not present

## 2024-05-18 DIAGNOSIS — T466X5A Adverse effect of antihyperlipidemic and antiarteriosclerotic drugs, initial encounter: Secondary | ICD-10-CM

## 2024-05-18 DIAGNOSIS — G720 Drug-induced myopathy: Secondary | ICD-10-CM

## 2024-05-18 DIAGNOSIS — Z7985 Long-term (current) use of injectable non-insulin antidiabetic drugs: Secondary | ICD-10-CM

## 2024-05-18 LAB — POCT GLYCOSYLATED HEMOGLOBIN (HGB A1C): Hemoglobin A1C: 5.6 % (ref 4.0–5.6)

## 2024-05-18 MED ORDER — LEVOCETIRIZINE DIHYDROCHLORIDE 5 MG PO TABS: 5.0000 mg | ORAL_TABLET | Freq: Every evening | ORAL | 3 refills | Status: AC

## 2024-05-18 MED ORDER — OZEMPIC (1 MG/DOSE) 4 MG/3ML ~~LOC~~ SOPN: 1.0000 mg | PEN_INJECTOR | SUBCUTANEOUS | 1 refills | Status: AC

## 2024-05-18 MED ORDER — IPRATROPIUM BROMIDE 0.06 % NA SOLN: 2.0000 | Freq: Four times a day (QID) | NASAL | 1 refills | Status: AC

## 2024-05-18 NOTE — Patient Instructions (Signed)
 Start xyzal

## 2024-05-18 NOTE — Progress Notes (Signed)
 Established Patient Office Visit  Subjective   Patient ID: Tina Mcgrath, female    DOB: 10/27/1955  Age: 68 y.o. MRN: [id number]  Chief Complaint  Patient presents with   Medical Management of Chronic Issues    Last A1c 5.7    HPI Pt is a 68 yo female with T2DM with microalbuminuria, HLD,  seasonal allergies, chronic pain due to Osteoarthritis who presents to the clinic for 6 month follow up.   She is checking her sugars at home. She has had no hypoglycemic events. Most of her fasting sugars are around 100. Most of her evening sugars are around 150. She denies any CP, palpitations, headaches, or vision changes. She is walking daily. She watches sugars and carbs in diet. She is compliant with medication but does take ozempic  every other week due to cost.   Continues to have chronic cough. She has stopped allegra and protonix  with no change. She feels like atrovent  nasal spray helps the most. She does feel like fall allergies of rag weed have worsened her allergy symptoms.   Review of Systems  All other systems reviewed and are negative.     Objective:     BP 118/75   Pulse (!) 102   Ht 5' 2 (1.575 m)   Wt 162 lb (73.5 kg)   SpO2 99%   BMI 29.63 kg/m  BP Readings from Last 3 Encounters:  05/18/24 118/75  04/30/24 134/85  11/15/23 134/83   Wt Readings from Last 3 Encounters:  05/18/24 162 lb (73.5 kg)  11/15/23 162 lb (73.5 kg)  07/01/23 156 lb 4 oz (70.9 kg)    .Tina Mcgrath Results for orders placed or performed in visit on 05/18/24  POCT HgB A1C   Collection Time: 05/18/24  8:23 AM  Result Value Ref Range   Hemoglobin A1C 5.6 4.0 - 5.6 %   HbA1c POC (<> result, manual entry)     HbA1c, POC (prediabetic range)     HbA1c, POC (controlled diabetic range)       Physical Exam Constitutional:      Appearance: Normal appearance. She is obese.  HENT:     Head: Normocephalic.  Cardiovascular:     Rate and Rhythm: Normal rate and regular rhythm.     Heart sounds: Normal  heart sounds.  Pulmonary:     Effort: Pulmonary effort is normal.     Breath sounds: Normal breath sounds.  Musculoskeletal:     Right lower leg: No edema.     Left lower leg: No edema.  Neurological:     General: No focal deficit present.     Mental Status: She is alert and oriented to person, place, and time.  Psychiatric:        Mood and Affect: Mood normal.      The 10-year ASCVD risk score (Arnett DK, et al., 2019) is: 8%    Assessment & Plan:  Tina Mcgrath was seen today for medical management of chronic issues.  Diagnoses and all orders for this visit:  Type 2 diabetes mellitus with diabetic microalbuminuria, without long-term current use of insulin (HCC) -     POCT HgB A1C -     Semaglutide , 1 MG/DOSE, (OZEMPIC , 1 MG/DOSE,) 4 MG/3ML SOPN; Inject 1 mg into the skin once a week. INJECT 1 MG INTO THE SKIN EVERY WEEK  Mixed hyperlipidemia  Seasonal allergies -     levocetirizine (XYZAL ) 5 MG tablet; Take 1 tablet (5 mg total) by mouth every evening. -  ipratropium (ATROVENT ) 0.06 % nasal spray; Place 2 sprays into both nostrils 4 (four) times daily.  Chronic cough -     levocetirizine (XYZAL ) 5 MG tablet; Take 1 tablet (5 mg total) by mouth every evening. -     ipratropium (ATROVENT ) 0.06 % nasal spray; Place 2 sprays into both nostrils 4 (four) times daily.  Statin myopathy   A1c to goal at 5.6. Stay on same medications: Ozempic  1mg  weekly.  BP to goal on 2nd recheck Microalbumin testing UTD and hx of abnormal. Declined BP medications due to BP being overall really good and even low sometimes. Declined SGLT-2 due to cost.  Pt has statin myopathy with multiple failures, on zetia . Eye exam scheduled for Monday next week.  Foot exam UTD. Pt declined all vaccines today including: Covid/Flu/Pneumonia Follow up in 6 months  Xyzal  to try for allergies Atrovent  refilled.    Return in about 6 months (around 11/15/2024).    Kinslea Frances, PA-C

## 2024-05-21 ENCOUNTER — Encounter: Payer: Self-pay | Admitting: Physician Assistant

## 2024-05-21 DIAGNOSIS — L661 Lichen planopilaris, unspecified: Secondary | ICD-10-CM | POA: Insufficient documentation

## 2024-05-23 ENCOUNTER — Encounter: Payer: Self-pay | Admitting: Physician Assistant

## 2024-07-03 ENCOUNTER — Ambulatory Visit: Admitting: Physician Assistant

## 2024-11-16 ENCOUNTER — Ambulatory Visit: Admitting: Physician Assistant
